# Patient Record
Sex: Female | Born: 1940 | Race: White | Hispanic: No | Marital: Married | Smoking: Never smoker
Health system: Southern US, Community
[De-identification: ages and names within clinical notes are randomized; demographics above are authoritative.]

## PROBLEM LIST (undated history)

## (undated) DIAGNOSIS — R7309 Other abnormal glucose: Secondary | ICD-10-CM

## (undated) DIAGNOSIS — D126 Benign neoplasm of colon, unspecified: Secondary | ICD-10-CM

## (undated) DIAGNOSIS — D649 Anemia, unspecified: Secondary | ICD-10-CM

## (undated) DIAGNOSIS — M1612 Unilateral primary osteoarthritis, left hip: Secondary | ICD-10-CM

## (undated) DIAGNOSIS — I10 Essential (primary) hypertension: Secondary | ICD-10-CM

## (undated) DIAGNOSIS — M5416 Radiculopathy, lumbar region: Secondary | ICD-10-CM

## (undated) DIAGNOSIS — Z9889 Other specified postprocedural states: Secondary | ICD-10-CM

## (undated) DIAGNOSIS — M47816 Spondylosis without myelopathy or radiculopathy, lumbar region: Secondary | ICD-10-CM

## (undated) DIAGNOSIS — R002 Palpitations: Secondary | ICD-10-CM

## (undated) DIAGNOSIS — M25562 Pain in left knee: Secondary | ICD-10-CM

## (undated) DIAGNOSIS — K625 Hemorrhage of anus and rectum: Secondary | ICD-10-CM

## (undated) DIAGNOSIS — K219 Gastro-esophageal reflux disease without esophagitis: Secondary | ICD-10-CM

## (undated) DIAGNOSIS — G8929 Other chronic pain: Secondary | ICD-10-CM

## (undated) DIAGNOSIS — E039 Hypothyroidism, unspecified: Secondary | ICD-10-CM

## (undated) DIAGNOSIS — Z8719 Personal history of other diseases of the digestive system: Secondary | ICD-10-CM

## (undated) DIAGNOSIS — E785 Hyperlipidemia, unspecified: Secondary | ICD-10-CM

## (undated) HISTORY — PX: CHOLECYSTECTOMY: SHX55

## (undated) HISTORY — PX: BREAST CYST ASPIRATION: SHX578

## (undated) HISTORY — PX: JOINT REPLACEMENT: SHX530

---

## 2004-09-30 ENCOUNTER — Ambulatory Visit: Payer: Self-pay | Admitting: Internal Medicine

## 2005-12-13 ENCOUNTER — Ambulatory Visit: Payer: Self-pay | Admitting: Gastroenterology

## 2006-10-16 ENCOUNTER — Ambulatory Visit: Payer: Self-pay | Admitting: Podiatry

## 2006-10-16 ENCOUNTER — Other Ambulatory Visit: Payer: Self-pay

## 2006-10-20 ENCOUNTER — Ambulatory Visit: Payer: Self-pay | Admitting: Podiatry

## 2007-04-03 ENCOUNTER — Ambulatory Visit: Payer: Self-pay | Admitting: Internal Medicine

## 2007-04-12 ENCOUNTER — Ambulatory Visit: Payer: Self-pay | Admitting: Internal Medicine

## 2009-06-10 ENCOUNTER — Ambulatory Visit: Payer: Self-pay | Admitting: Gastroenterology

## 2009-10-02 ENCOUNTER — Observation Stay: Payer: Self-pay | Admitting: Internal Medicine

## 2012-08-28 ENCOUNTER — Ambulatory Visit: Payer: Self-pay | Admitting: Orthopedic Surgery

## 2012-09-20 ENCOUNTER — Ambulatory Visit: Payer: Self-pay | Admitting: Orthopedic Surgery

## 2012-10-15 ENCOUNTER — Ambulatory Visit: Payer: Self-pay | Admitting: Orthopedic Surgery

## 2012-10-15 DIAGNOSIS — I499 Cardiac arrhythmia, unspecified: Secondary | ICD-10-CM

## 2012-10-15 LAB — BASIC METABOLIC PANEL
BUN: 16 mg/dL (ref 7–18)
Chloride: 106 mmol/L (ref 98–107)
Co2: 26 mmol/L (ref 21–32)
Creatinine: 0.73 mg/dL (ref 0.60–1.30)
Potassium: 3.7 mmol/L (ref 3.5–5.1)
Sodium: 140 mmol/L (ref 136–145)

## 2012-10-15 LAB — CBC
HCT: 39.5 % (ref 35.0–47.0)
MCH: 31.1 pg (ref 26.0–34.0)
Platelet: 224 10*3/uL (ref 150–440)
RBC: 4.27 10*6/uL (ref 3.80–5.20)
WBC: 7.6 10*3/uL (ref 3.6–11.0)

## 2012-10-15 LAB — MRSA PCR SCREENING

## 2012-10-25 ENCOUNTER — Inpatient Hospital Stay: Payer: Self-pay | Admitting: General Practice

## 2012-10-26 LAB — HEMOGLOBIN: HGB: 12.2 g/dL (ref 12.0–16.0)

## 2012-10-26 LAB — BASIC METABOLIC PANEL
BUN: 12 mg/dL (ref 7–18)
Calcium, Total: 8.1 mg/dL — ABNORMAL LOW (ref 8.5–10.1)
Chloride: 99 mmol/L (ref 98–107)
Co2: 25 mmol/L (ref 21–32)
Creatinine: 0.69 mg/dL (ref 0.60–1.30)
Osmolality: 266 (ref 275–301)
Potassium: 4.7 mmol/L (ref 3.5–5.1)

## 2012-10-26 LAB — PLATELET COUNT: Platelet: 171 10*3/uL (ref 150–440)

## 2012-10-27 LAB — BASIC METABOLIC PANEL
Calcium, Total: 8.3 mg/dL — ABNORMAL LOW (ref 8.5–10.1)
Chloride: 104 mmol/L (ref 98–107)
Co2: 26 mmol/L (ref 21–32)
EGFR (African American): >60
Osmolality: 272 (ref 275–301)
Potassium: 3.9 mmol/L (ref 3.5–5.1)
Sodium: 137 mmol/L (ref 136–145)

## 2012-10-29 LAB — PATHOLOGY REPORT

## 2013-08-08 ENCOUNTER — Ambulatory Visit: Payer: Self-pay | Admitting: Ophthalmology

## 2013-08-08 DIAGNOSIS — I10 Essential (primary) hypertension: Secondary | ICD-10-CM

## 2013-08-19 ENCOUNTER — Ambulatory Visit: Payer: Self-pay | Admitting: Ophthalmology

## 2013-08-28 ENCOUNTER — Ambulatory Visit: Payer: Self-pay | Admitting: Orthopedic Surgery

## 2013-09-23 ENCOUNTER — Ambulatory Visit: Payer: Self-pay | Admitting: Ophthalmology

## 2013-10-15 ENCOUNTER — Ambulatory Visit: Payer: Self-pay | Admitting: Orthopedic Surgery

## 2013-10-15 LAB — BASIC METABOLIC PANEL
Anion Gap: 6 — ABNORMAL LOW (ref 7–16)
BUN: 11 mg/dL (ref 7–18)
Co2: 29 mmol/L (ref 21–32)
EGFR (African American): >60
Glucose: 83 mg/dL (ref 65–99)
Potassium: 3.6 mmol/L (ref 3.5–5.1)
Sodium: 139 mmol/L (ref 136–145)

## 2013-10-15 LAB — CBC
HCT: 41.1 % (ref 35.0–47.0)
MCHC: 33.8 g/dL (ref 32.0–36.0)
MCV: 90 fL (ref 80–100)
Platelet: 223 10*3/uL (ref 150–440)
RBC: 4.57 10*6/uL (ref 3.80–5.20)
WBC: 7.4 10*3/uL (ref 3.6–11.0)

## 2013-10-15 LAB — URINALYSIS, COMPLETE
Bacteria: NONE SEEN
Bilirubin,UR: NEGATIVE
Glucose,UR: NEGATIVE mg/dL (ref 0–75)
Leukocyte Esterase: NEGATIVE
Nitrite: NEGATIVE
Ph: 5 (ref 4.5–8.0)
RBC,UR: 2 /HPF (ref 0–5)
Squamous Epithelial: <1
WBC UR: 1 /HPF (ref 0–5)

## 2013-10-15 LAB — APTT: Activated PTT: 34.6 secs (ref 23.6–35.9)

## 2013-10-15 LAB — PROTIME-INR
INR: 0.9
Prothrombin Time: 12.8 secs (ref 11.5–14.7)

## 2013-10-15 LAB — SEDIMENTATION RATE: Erythrocyte Sed Rate: 8 mm/hr (ref 0–30)

## 2013-10-24 ENCOUNTER — Inpatient Hospital Stay: Payer: Self-pay | Admitting: Orthopedic Surgery

## 2013-10-25 LAB — BASIC METABOLIC PANEL
Anion Gap: 3 — ABNORMAL LOW (ref 7–16)
BUN: 16 mg/dL (ref 7–18)
CALCIUM: 8.2 mg/dL — AB (ref 8.5–10.1)
CHLORIDE: 101 mmol/L (ref 98–107)
Co2: 27 mmol/L (ref 21–32)
Creatinine: 0.72 mg/dL (ref 0.60–1.30)
EGFR (African American): >60
EGFR (Non-African Amer.): >60
GLUCOSE: 117 mg/dL — AB (ref 65–99)
OSMOLALITY: 265 (ref 275–301)
Potassium: 4 mmol/L (ref 3.5–5.1)
SODIUM: 131 mmol/L — AB (ref 136–145)

## 2013-10-25 LAB — PLATELET COUNT: Platelet: 158 10*3/uL (ref 150–440)

## 2013-10-25 LAB — HEMOGLOBIN: HGB: 11.4 g/dL — AB (ref 12.0–16.0)

## 2013-10-26 LAB — BASIC METABOLIC PANEL
Anion Gap: 2 — ABNORMAL LOW (ref 7–16)
BUN: 9 mg/dL (ref 7–18)
CO2: 31 mmol/L (ref 21–32)
CREATININE: 0.69 mg/dL (ref 0.60–1.30)
Calcium, Total: 7.7 mg/dL — ABNORMAL LOW (ref 8.5–10.1)
Chloride: 101 mmol/L (ref 98–107)
EGFR (Non-African Amer.): >60
Glucose: 104 mg/dL — ABNORMAL HIGH (ref 65–99)
Osmolality: 267 (ref 275–301)
POTASSIUM: 4.1 mmol/L (ref 3.5–5.1)
Sodium: 134 mmol/L — ABNORMAL LOW (ref 136–145)

## 2013-10-26 LAB — HEMOGLOBIN: HGB: 10.9 g/dL — ABNORMAL LOW (ref 12.0–16.0)

## 2013-10-28 LAB — PATHOLOGY REPORT

## 2014-08-14 ENCOUNTER — Ambulatory Visit: Payer: Self-pay | Admitting: Internal Medicine

## 2015-01-05 ENCOUNTER — Ambulatory Visit: Payer: Self-pay | Admitting: Gastroenterology

## 2015-02-06 NOTE — Op Note (Signed)
PATIENT NAME:  Lauren Watkins, Lauren Watkins MR#:  [id number] DATE OF BIRTH:  1941-05-26  DATE OF PROCEDURE:  09/23/2013  PREOPERATIVE DIAGNOSIS:  Cataract, left eye.    POSTOPERATIVE DIAGNOSIS:  Cataract, left eye.  PROCEDURE PERFORMED:  Extracapsular cataract extraction using phacoemulsification with placement of an Alcon SN6CWS, 21.0-diopter posterior chamber lens, serial [id number].  SURGEON:  Loura Back. Argus Caraher, MD  ASSISTANT:  None.  ANESTHESIA:  4% lidocaine and 0.75% Marcaine in a 50/50 mixture with 10 units/mL of Hylenex added, given as a peribulbar.   ANESTHESIOLOGIST:  Dr. Ronelle Nigh  COMPLICATIONS:  None.  ESTIMATED BLOOD LOSS:  Less than 1 ml.  DESCRIPTION OF PROCEDURE:  The patient was brought to the operating room and given a peribulbar block.  The patient was then prepped and draped in the usual fashion.  The vertical rectus muscles were imbricated using 5-0 silk sutures.  These sutures were then clamped to the sterile drapes as bridle sutures.  A limbal peritomy was performed extending two clock hours and hemostasis was obtained with cautery.  A partial thickness scleral groove was made at the surgical limbus and dissected anteriorly in a lamellar dissection using an Alcon crescent knife.  The anterior chamber was entered supero-temporally with a Superblade and through the lamellar dissection with a 2.6 mm keratome.  DisCoVisc was used to replace the aqueous and a continuous tear capsulorrhexis was carried out.  Hydrodissection and hydrodelineation were carried out with balanced salt and a 27 gauge canula.  The nucleus was rotated to confirm the effectiveness of the hydrodissection.  Phacoemulsification was carried out using a divide-and-conquer technique.  Total ultrasound time was 55 seconds with an average power of 24.6 percent and CDE of 23.93.  Irrigation/aspiration was used to remove the residual cortex.  DisCoVisc was used to inflate the capsule and the internal incision was  enlarged to 3 mm with the crescent knife.  The intraocular lens was folded and inserted into the capsular bag using the AcrySert delivery system.  Irrigation/aspiration was used to remove the residual DisCoVisc.  Miostat was injected into the anterior chamber through the paracentesis track to inflate the anterior chamber and induce miosis. A tenth of a milliliter of cefuroxime was injected via the paracentesis tract containing 1 mg of drug. The wound was checked for leaks and none were found. The conjunctiva was closed with cautery and the bridle sutures were removed.  Two drops of 0.3% Vigamox were placed on the eye.   An eye shield was placed on the eye.  The patient was discharged to the recovery room in good condition. ____________________________ Loura Back Harry Shuck, MD sad:sb D: 09/23/2013 13:31:39 ET T: 09/23/2013 14:07:31 ET JOB#: [id number]  cc: Remo Lipps A. Adamarie Izzo, MD, <Dictator> Martie Lee MD ELECTRONICALLY SIGNED 09/30/2013 12:47

## 2015-02-06 NOTE — Discharge Summary (Signed)
PATIENT NAME:  Lauren Watkins, Lauren Watkins MR#:  [id number] DATE OF BIRTH:  1941-02-07  DATE OF ADMISSION:  10/25/2012 DATE OF DISCHARGE:  10/28/2012  ADMITTING DIAGNOSIS: Left knee osteoarthritis.   DISCHARGE DIAGNOSIS: Left knee osteoarthritis.   PROCEDURE: Left total knee replacement.   SURGEON: Laurene Footman, M.D.   ASSISTANT: Rachelle Hora, PA-C.   ANESTHESIA: Spinal.   ESTIMATED BLOOD LOSS: 50 mL.  TOURNIQUET TIME:  67 minutes at 350 mmHg.  IMPLANTS:  Medacta GMK primary total knee systems, size 2 patella, size 4 left standard femoral component, size 3 left fixed tibial component and a size 3, 14 mm UCA tibial insert.   CONDITION: To recovery room stable.   SPECIMEN: Cut ends of bone.   HISTORY: The patient is a 74 year old female who underwent a (Dictation Anomaly)<MISSING TEXT> knee CT. She has discussed knee replacement for her debilitating knee pain. Despite anti-inflammatory medications as well as exercise programs, she has not had any relief. She continues had debilitating pain that limits her ability to do normal activities. She has had difficulty getting through a grocery store and take care of herself at home because of difficulty standing for a prolonged time.   PHYSICAL EXAMINATION:  LUNGS: Clear to auscultation.   HEART: Regular rate and rhythm.  HEENT: She has a full upper and lower dentures.  EXTREMITIES:  On left knee exam she has range of motion of 5 to 100 degrees with varus deformity. She has a large Baker's cyst present, marked patellofemoral and medial joint line crepitation. She walks with an antalgic gait and it appears quite unsteady secondary to pain.   HOSPITAL COURSE:  The patient was admitted to the hospital on 10/25/2012. She had surgery that same day and was brought to the orthopedic floor from the PAC-U in stable condition. Throughout the patient's stay, her vital signs and lab work was monitored and remained within normal limits. The patient progressed very  well with physical therapy and by postoperative day 3, she was ready for discharge home with home PT.   DISCHARGE INSTRUCTIONS:  The patient may begin weight-bearing as tolerated.  She should keep the leg elevated and wear TED hose daily. She is to cough and deep breathe every 1 hour. She should use the Polar Care unit to maintain temperature between 40 and 50 degrees.  She is to keep the dressing clean and dry and call us if any water gets under it. If any bright red bleeding from the incision or wound, she is to call our office. She is to follow-up with [facility] orthopedics in 2 weeks for staple removal.   DISCHARGE MEDICATIONS:  Atorvastatin 20 mg oral tablet, 1 tablet orally once a day at bedtime. Metoprolol succinate 50 mg oral tablet, extended release 1 tablet orally once a day. Synthroid 137 mcg oral tablet 1 tablet orally once a day. Oxycodone 5 mg oral tablet 1 to 2 tabs orally every 4 hours as needed for pain. Tramadol 50 mg oral tablet 1 to 2 tablets oral every 4 hours as needed for pain.  Xarelto 1 tablet orally once a day in the morning, stop after 9 days. Daily multiple vitamins oral tablet 1 tablet orally once a day. She needs to stop taking aspirin  325, one cap orally once a day     ____________________________ Duanne Guess, PA-C tcg:ct D: 10/29/2012 12:46:45 ET T: 10/29/2012 13:02:05 ET JOB#: [id number]  cc: Duanne Guess, PA-C, <Dictator> Laurene Footman, MD Duanne Guess PA ELECTRONICALLY  SIGNED 11/06/2012 15:43

## 2015-02-06 NOTE — Op Note (Signed)
PATIENT NAME:  Lauren Watkins, Lauren Watkins MR#:  [id number] DATE OF BIRTH:  1941-02-26  DATE OF PROCEDURE:  08/19/2013  PREOPERATIVE DIAGNOSIS:  Cataract, right eye.   POSTOPERATIVE DIAGNOSIS:  Cataract, right eye.  PROCEDURE PERFORMED:  Extracapsular cataract extraction using phacoemulsification with placement of an Alcon SN6CWF, 21.0-diopter posterior chamber lens, serial number [id number].  SURGEON:  Loura Back. Trumaine Wimer, MD  ASSISTANT:  None.  ANESTHESIA:  Lidocaine 4% and 0.75% Marcaine in a 50/50 mixture with 10 units/mL of Hylenex added given as a peribulbar.  ANESTHESIOLOGIST: Dr. Kayleen Memos.    COMPLICATIONS:  None.  ESTIMATED BLOOD LOSS:  Less than 1 ml.  DESCRIPTION OF PROCEDURE:  The patient was brought to the operating room and given a peribulbar block.  The patient was then prepped and draped in the usual fashion.  The vertical rectus muscles were imbricated using 5-0 silk sutures.  These sutures were then clamped to the sterile drapes as bridle sutures.  A limbal peritomy was performed extending two clock hours and hemostasis was obtained with cautery.  A partial thickness scleral groove was made at the surgical limbus and dissected anteriorly in a lamellar dissection using an Alcon crescent knife.  The anterior chamber was entered superonasally with a Superblade and through the lamellar dissection with a 2.6 mm keratome.  DisCoVisc was used to replace the aqueous and a continuous tear capsulorrhexis was carried out.  Hydrodissection and hydrodelineation were carried out with balanced salt and a 27 gauge canula.  The nucleus was rotated to confirm the effectiveness of the hydrodissection.  Phacoemulsification was carried out using a divide-and-conquer technique.  Total ultrasound time was 1 minute and 16 seconds with an average power of 25.5%, CDE of 28.60.    Irrigation/aspiration was used to remove the residual cortex.  DisCoVisc was used to inflate the capsule and the internal incision  was enlarged to 3 mm with the crescent knife.  The intraocular lens was folded and inserted into the capsular bag using the AcrySert delivery system.  Irrigation/aspiration was used to remove the residual DisCoVisc.  Miostat was injected into the anterior chamber through the paracentesis track to inflate the anterior chamber and induce miosis.  No antibiotic was injected.  The wound was checked for leaks and none were found. The conjunctiva was closed with cautery and the bridle sutures were removed.  Two drops of 0.3% Vigamox were placed on the eye.   An eye shield was placed on the eye.  The patient was discharged to the recovery room in good condition.  ____________________________ Loura Back Yamilet Mcfayden, MD sad:cs D: 08/19/2013 13:53:21 ET T: 08/19/2013 14:37:42 ET JOB#: [id number]  cc: Remo Lipps A. Katasha Riga, MD, <Dictator> Martie Lee MD ELECTRONICALLY SIGNED 08/26/2013 13:03

## 2015-02-06 NOTE — Op Note (Signed)
PATIENT NAME:  Lauren, Watkins MR#:  [id number] DATE OF BIRTH:  [birth year]  DATE OF PROCEDURE:  10/25/2012  POSTOPERATIVE DIAGNOSIS:  Left knee osteoarthritis.   POSTOPERATIVE DIAGNOSIS:  Left knee osteoarthritis.  PROCEDURE: Left total knee replacement.   SURGEON:  Laurene Footman, M.D.   ASSISTANT:   Rachelle Hora, PA-C.   ANESTHESIA:  Spinal.   DESCRIPTION OF PROCEDURE:  The patient was brought to the operating room. After adequate spinal anesthesia was obtained, the left leg was prepped and draped in the usual sterile fashion with a tourniquet applied to the upper leg. Alvarado legholder was also utilized. After patient identification and time-out procedures were completed, the leg was exsanguinated with an Esmarch, and the tourniquet raised to 300 mmHg. Subsequently, after bone cuts were made,  the tourniquet was raised to 350 mmHg because of oozing through the bone marrow. Inspection of the knee revealed severe tricompartmental osteoarthritis with loss of essentially all the articular cartilage. Lateral compartment had several millimeters of bone loss with sclerotic bone on femoral and tibial condyles. The ACL was excised along with the infrapatellar fat pad. The Medacta cutting block was applied after exposing the proximal tibia, alignment checked and a cutting block applied. The proximal tibia cut was then carried out with excision of the meniscus. The femur was approached in a similar fashion, exposing the femur to apply the Medacta bone cutting block, checking the alignment and resections. They appeared to match the planned resections, and the distal femoral cut was carried out with distal drill holes made. The femur templated to a 4, and the 4 cutting block was applied. Anterior, posterior and chamfer cuts carried out without notching. The tibia sized to a 3, and with 1 of the pins remaining from the guide for rotational alignment, the #3 tibial plate was applied, pinned in place and proximal  drill hole made along with keel to set the implant trial in place with 12 and 14 mm inserts were placed, and the 14 mm insert gave good stability in flexion and extension. The patella was cut with a guide and sized to a size 2 after drill holes were made. The notch and distal femoral drill holes were made as well for final implants. The patella tracked well with no touch technique. At this point, all trial components were removed, and the bone surfaces were thoroughly irrigated and then dried. A combination of Sensorcaine 0.25% with epinephrine, morphine and Toradol was injected into the posterior capsule and medial arthrotomy. The bony surfaces were then dried, and the components cemented in place; first the tibial component, followed by the tibial insert, and then the femoral component, excess cement being removed. The knee is held in extension with the patellar button clamped in place, again, with bone cement. After the bone cement had set, excess cement was removed, and the knee again thoroughly irrigated. Tourniquet was let down, and hemostasis achieved with electrocautery. The arthrotomy was repaired using a heavy quill suture, 2-0 quill subcutaneously and skin staples. Xeroform, 4 x 4's, ABD, Webril, Polar Care device and Ace wrap were then applied. The patient was sent to the recovery room in stable condition.   ESTIMATED BLOOD LOSS: 50 mL.   TOURNIQUET TIME: 67 minutes at 350 mmHg.   IMPLANTS: Medacta GMK Primary Total Knee System, size 2 patella, size 4 left standard femoral component, size 3 left fixed tibial component and a size 3, 14 mm UC tibial insert.   CONDITION: To recovery room stable.  SPECIMENS: Cut ends of bone.    ____________________________ Laurene Footman, MD mjm:dm D: 10/25/2012 12:03:40 ET T: 10/25/2012 12:26:22 ET JOB#: [id number]  cc: Laurene Footman, MD, <Dictator> Laurene Footman MD ELECTRONICALLY SIGNED 10/25/2012 15:13

## 2015-02-07 NOTE — Discharge Summary (Signed)
PATIENT NAME:  Lauren Watkins, Lauren Watkins MR#:  [id number] DATE OF BIRTH:  Oct 28, 1940  DATE OF ADMISSION:  10/24/2013 DATE OF DISCHARGE:  10/28/2013  ADMITTING DIAGNOSIS: Degenerative joint disease, right knee.   DISCHARGE DIAGNOSIS: Degenerative joint disease, right knee.   OPERATION: On 10/24/2013, she had a right total knee arthroplasty.   SURGEON: Hessie Knows, MD   ANESTHESIA: Spinal.   ESTIMATED BLOOD LOSS: 150 mL.  TOURNIQUET TIME: 78 minutes at 300 mmHg.  IMPLANTS: Medacta GMK sphere right 4 femur; 4, 12 insert; T3-4 tibial tray; 2 patella.   COMPLICATIONS: None.    HISTORY: Lauren Watkins is a 74 year old female who failed conservative measures in treatment for her degenerative joint disease of her right knee. She consented to proceed with an elective right total knee arthroplasty.   PHYSICAL EXAMINATION: LUNGS: Clear.  HEART: Regular rate and rhythm.  HEENT: Normal.  MUSCULOSKELETAL: 10 to 110 degrees range of motion of the right knee. Approximately 10 degrees valgus deformity, passively correctable. Significant patellofemoral crepitation with range of motion of the right knee. Distally, she is neurovascularly intact with strong DP pulse and posterior tibialis pulse. There is trace edema.   HOSPITAL COURSE: After initial admission on 10/24/2013, the patient underwent surgery the same day. She had good pain control afterwards and was brought to the orthopedic floor from the PACU. Her initial hemoglobin was 11.4. She was mildly hypotensive in the 90s/50s that day. She was also hyponatremic at 131. Physical therapy was begun on that day, but she did have some issues with nausea. On postoperative day 2, 10/26/2013, she had slow progress with physical therapy. She continued with nausea. On postoperative day 3, 10/27/2013, she continued to have slow progress with physical therapy, and refused her afternoon physical therapy session. She had a BM on this day. On postoperative day 4, 10/28/2013, she is  stable and ready for discharge.   CONDITION AT DISCHARGE: Stable.   DISPOSITION: The patient was sent home with home health PT.   DISCHARGE INSTRUCTIONS: The patient will follow up with [facility] in 2 weeks for staple removal. She will do physical therapy and weight-bear as tolerated on the right lower extremity. Diet is regular. TED hose thigh high bilaterally. Dressing change once daily on an as-needed basis. Please see discharge instructions for a complete list of discharge medications.  ____________________________ Priscille Shadduck M. Tretha Sciara, NP amb:jcm D: 10/28/2013 08:03:47 ET T: 10/28/2013 13:50:44 ET JOB#: [id number]  cc: Undra Trembath M. Tretha Sciara, NP, <Dictator> Kem Kays Giovanna Kemmerer FNP ELECTRONICALLY SIGNED 11/04/2013 8:14

## 2015-02-07 NOTE — Op Note (Signed)
PATIENT NAME:  Lauren Watkins, Lauren Watkins MR#:  [id number] DATE OF BIRTH:  [birth year]  DATE OF PROCEDURE:  10/24/2013  SURGEON: Hessie Knows, M.D.   ASSSISTANT: April Tretha Sciara, NP.   PREOPERATIVE DIAGNOSIS: Right knee osteoarthritis.   POSTOPERATIVE DIAGNOSIS: Right knee osteoarthritis.   PROCEDURE: Right total knee replacement.   ANESTHESIA: Spinal.    DESCRIPTION OF PROCEDURE: The patient was brought to the operating room and after adequate spinal anesthesia was obtained, the right leg was prepped and draped in the usual sterile fashion with a tourniquet applied to the upper thigh. After prepping and draping in the usual sterile fashion, appropriate patient identification and timeout procedures were completed. The leg was exsanguinated with an Esmarch and the tourniquet raised to 300 mmHg. A midline skin incision made, followed by medial parapatellar arthrotomy. Inspection revealed severe tricompartmental osteoarthritis with just a small amount of cartilage remaining on the lateral condyle. The ACL was excised off the infrapatellar fat pad. The proximal tibia was exposed to allow for application of the Medacta tibial cutting guide block. The alignment was checked and the proximal tibia cut carried out.   Next, the femoral alignment guide was placed as well after removing cartilage off this lateral portion of the femur. The distal drill holes were made and the distal femoral cut carried out with resections matching the preop template. The 4r cutting guide was applied, anterior and posterior and chamfer cuts made. Again, cuts matching planned resection. A trial was then placed and posterior osteophytes removed with the use of a small curved osteotome. The distal femoral drill holes were made and the 4 trochlear groove cut was carried out. That was then removed and the posterior horns of the menisci were excised and the tibia, size 3 template was placed. Rotation and alignment appeared to be appropriate. The proximal  drill hole was made, followed by the keel punch. With trial components, a 12 mm insert gave full extension with good stability in all directions.   At this point, the patella was cut using the patellar cutting guide and sized to a size 2 with 3 drill holes made. At this point, all trials were removed and the knee was infiltrated with Exparel diluted with saline injected in the posterior aspect of the knee, medial, lateral and along the capsule along with a combination of morphine, Toradol and Sensorcaine with epinephrine diluted with saline. The tourniquet was let down and there was a small lateral geniculate bleeder, which was cauterized. The tourniquet was then again raised. The bony surfaces thoroughly irrigated and dried. The tibial component was cemented into place first with a 12 mm insert snapped into place with a set screw and then hand tightened. The femoral component was then cemented in place with the knee held in extension as the cement set with excess cement being removed. The patellar component was cemented into place with the patellar clamp and this was left in this position so the cement was entirely hard. There was some cement in the notch along the medial and lateral borders of the femur, which were removed. The knee was again thoroughly irrigated. There was excellent patellofemoral tracking and stability with full extension passively. The arthrotomy was then repaired using a heavy quill suture, 2-0 quill subcutaneously and skin staples. Xeroform, 4 x 4's, ABD, Webril and a Polar Care, followed by Ace wrap applied. The patient was then sent to the recovery room in stable condition.  TOURNIQUET TIME: 78 minutes at 300 mmHg.   ESTIMATED BLOOD LOSS:  150 mL.   COMPLICATIONS: None.   SPECIMEN: Cut ends of bone.   IMPLANTS: Medacta GMK sphere size 4 right femur, 4 right 12 mm insert, size 2 patella and the tibia was the T3-I4 right fixed tibial tray. All components cemented.    ____________________________ Laurene Footman, MD mjm:aw D: 10/24/2013 10:01:18 ET T: 10/24/2013 10:30:36 ET JOB#: [id number]  cc: Laurene Footman, MD, <Dictator> Laurene Footman MD ELECTRONICALLY SIGNED 10/24/2013 14:23

## 2015-02-09 LAB — SURGICAL PATHOLOGY

## 2015-08-12 ENCOUNTER — Other Ambulatory Visit: Payer: Self-pay | Admitting: Internal Medicine

## 2015-08-12 DIAGNOSIS — Z1231 Encounter for screening mammogram for malignant neoplasm of breast: Secondary | ICD-10-CM

## 2015-08-27 ENCOUNTER — Ambulatory Visit: Payer: Medicare Other

## 2015-09-08 ENCOUNTER — Ambulatory Visit
Admission: RE | Admit: 2015-09-08 | Discharge: 2015-09-08 | Disposition: A | Discharge status: Home / Self Care | Payer: Medicare Other | Source: Ambulatory Visit | Attending: Internal Medicine | Admitting: Internal Medicine

## 2015-09-08 ENCOUNTER — Other Ambulatory Visit: Payer: Self-pay | Admitting: Internal Medicine

## 2015-09-08 DIAGNOSIS — Z1231 Encounter for screening mammogram for malignant neoplasm of breast: Secondary | ICD-10-CM | POA: Insufficient documentation

## 2016-08-22 ENCOUNTER — Other Ambulatory Visit: Payer: Self-pay | Admitting: Internal Medicine

## 2016-08-22 DIAGNOSIS — Z1231 Encounter for screening mammogram for malignant neoplasm of breast: Secondary | ICD-10-CM

## 2016-09-02 DIAGNOSIS — F411 Generalized anxiety disorder: Secondary | ICD-10-CM | POA: Insufficient documentation

## 2016-09-15 ENCOUNTER — Ambulatory Visit: Payer: Medicare Other | Attending: Internal Medicine

## 2016-11-14 ENCOUNTER — Ambulatory Visit
Admission: RE | Admit: 2016-11-14 | Discharge: 2016-11-14 | Disposition: A | Discharge status: Home / Self Care | Payer: Medicare Other | Source: Ambulatory Visit | Attending: Internal Medicine | Admitting: Internal Medicine

## 2016-11-14 DIAGNOSIS — Z1231 Encounter for screening mammogram for malignant neoplasm of breast: Secondary | ICD-10-CM

## 2016-12-29 ENCOUNTER — Other Ambulatory Visit: Payer: Self-pay | Admitting: Internal Medicine

## 2016-12-29 DIAGNOSIS — M545 Low back pain, unspecified: Secondary | ICD-10-CM

## 2016-12-29 DIAGNOSIS — G8929 Other chronic pain: Secondary | ICD-10-CM

## 2017-01-11 ENCOUNTER — Ambulatory Visit
Admission: RE | Admit: 2017-01-11 | Discharge: 2017-01-11 | Disposition: A | Discharge status: Home / Self Care | Payer: Medicare Other | Source: Ambulatory Visit | Attending: Internal Medicine | Admitting: Internal Medicine

## 2017-01-11 DIAGNOSIS — M1288 Other specific arthropathies, not elsewhere classified, other specified site: Secondary | ICD-10-CM | POA: Diagnosis not present

## 2017-01-11 DIAGNOSIS — M4316 Spondylolisthesis, lumbar region: Secondary | ICD-10-CM | POA: Insufficient documentation

## 2017-01-11 DIAGNOSIS — Q7649 Other congenital malformations of spine, not associated with scoliosis: Secondary | ICD-10-CM | POA: Insufficient documentation

## 2017-01-11 DIAGNOSIS — M545 Low back pain: Secondary | ICD-10-CM | POA: Insufficient documentation

## 2017-01-11 DIAGNOSIS — G8929 Other chronic pain: Secondary | ICD-10-CM | POA: Diagnosis not present

## 2017-01-11 DIAGNOSIS — M48061 Spinal stenosis, lumbar region without neurogenic claudication: Secondary | ICD-10-CM | POA: Diagnosis not present

## 2017-01-11 LAB — POCT I-STAT CREATININE: Creatinine, Ser: 0.7 mg/dL (ref 0.44–1.00)

## 2017-01-11 MED ORDER — GADOBENATE DIMEGLUMINE 529 MG/ML IV SOLN
20.0000 mL | Freq: Once | INTRAVENOUS | Status: AC | PRN
  Administered 2017-01-11: 18 mL via INTRAVENOUS

## 2017-12-21 ENCOUNTER — Other Ambulatory Visit: Payer: Self-pay | Admitting: Internal Medicine

## 2017-12-21 DIAGNOSIS — Z1231 Encounter for screening mammogram for malignant neoplasm of breast: Secondary | ICD-10-CM

## 2018-01-03 ENCOUNTER — Ambulatory Visit
Admission: RE | Admit: 2018-01-03 | Discharge: 2018-01-03 | Disposition: A | Discharge status: Home / Self Care | Payer: Medicare Other | Source: Ambulatory Visit | Attending: Internal Medicine | Admitting: Internal Medicine

## 2018-01-03 DIAGNOSIS — Z1231 Encounter for screening mammogram for malignant neoplasm of breast: Secondary | ICD-10-CM

## 2018-05-03 ENCOUNTER — Other Ambulatory Visit: Payer: Self-pay | Admitting: Internal Medicine

## 2018-05-03 DIAGNOSIS — M79622 Pain in left upper arm: Secondary | ICD-10-CM

## 2018-05-03 DIAGNOSIS — R1011 Right upper quadrant pain: Secondary | ICD-10-CM

## 2018-05-28 ENCOUNTER — Ambulatory Visit
Admission: RE | Admit: 2018-05-28 | Discharge: 2018-05-28 | Disposition: A | Discharge status: Home / Self Care | Payer: Medicare Other | Source: Ambulatory Visit | Attending: Internal Medicine | Admitting: Internal Medicine

## 2018-05-28 DIAGNOSIS — M79622 Pain in left upper arm: Secondary | ICD-10-CM | POA: Diagnosis present

## 2018-05-28 DIAGNOSIS — R1011 Right upper quadrant pain: Secondary | ICD-10-CM | POA: Diagnosis present

## 2018-08-10 ENCOUNTER — Other Ambulatory Visit: Payer: Self-pay | Admitting: Internal Medicine

## 2018-08-10 DIAGNOSIS — R1031 Right lower quadrant pain: Secondary | ICD-10-CM

## 2018-08-10 DIAGNOSIS — K760 Fatty (change of) liver, not elsewhere classified: Secondary | ICD-10-CM

## 2018-08-21 ENCOUNTER — Ambulatory Visit
Admission: RE | Admit: 2018-08-21 | Discharge: 2018-08-21 | Disposition: A | Discharge status: Home / Self Care | Payer: Medicare Other | Source: Ambulatory Visit | Attending: Internal Medicine | Admitting: Internal Medicine

## 2018-08-21 DIAGNOSIS — K5730 Diverticulosis of large intestine without perforation or abscess without bleeding: Secondary | ICD-10-CM | POA: Insufficient documentation

## 2018-08-21 DIAGNOSIS — M25551 Pain in right hip: Secondary | ICD-10-CM | POA: Insufficient documentation

## 2018-08-21 DIAGNOSIS — K760 Fatty (change of) liver, not elsewhere classified: Secondary | ICD-10-CM | POA: Insufficient documentation

## 2018-08-21 DIAGNOSIS — R1031 Right lower quadrant pain: Secondary | ICD-10-CM | POA: Diagnosis not present

## 2018-08-21 MED ORDER — IOPAMIDOL (ISOVUE-300) INJECTION 61%
100.0000 mL | Freq: Once | INTRAVENOUS | Status: AC | PRN
  Administered 2018-08-21: 100 mL via INTRAVENOUS

## 2018-11-26 DIAGNOSIS — M51369 Other intervertebral disc degeneration, lumbar region without mention of lumbar back pain or lower extremity pain: Secondary | ICD-10-CM | POA: Insufficient documentation

## 2019-07-30 ENCOUNTER — Other Ambulatory Visit: Payer: Self-pay | Admitting: Internal Medicine

## 2019-07-30 DIAGNOSIS — Z1231 Encounter for screening mammogram for malignant neoplasm of breast: Secondary | ICD-10-CM

## 2019-09-03 ENCOUNTER — Ambulatory Visit
Admission: RE | Admit: 2019-09-03 | Discharge: 2019-09-03 | Disposition: A | Discharge status: Home / Self Care | Payer: Medicare Other | Source: Ambulatory Visit | Attending: Internal Medicine | Admitting: Internal Medicine

## 2019-09-03 DIAGNOSIS — Z1231 Encounter for screening mammogram for malignant neoplasm of breast: Secondary | ICD-10-CM | POA: Insufficient documentation

## 2020-11-04 ENCOUNTER — Other Ambulatory Visit: Payer: Self-pay | Admitting: Internal Medicine

## 2020-11-04 DIAGNOSIS — Z1231 Encounter for screening mammogram for malignant neoplasm of breast: Secondary | ICD-10-CM

## 2020-11-24 ENCOUNTER — Other Ambulatory Visit: Payer: Self-pay

## 2020-11-24 ENCOUNTER — Ambulatory Visit
Admission: RE | Admit: 2020-11-24 | Discharge: 2020-11-24 | Disposition: A | Discharge status: Home / Self Care | Payer: Medicare Other | Source: Ambulatory Visit | Attending: Internal Medicine | Admitting: Internal Medicine

## 2020-11-24 DIAGNOSIS — Z1231 Encounter for screening mammogram for malignant neoplasm of breast: Secondary | ICD-10-CM | POA: Diagnosis not present

## 2021-07-28 DIAGNOSIS — I10 Essential (primary) hypertension: Secondary | ICD-10-CM | POA: Insufficient documentation

## 2021-07-28 DIAGNOSIS — R7309 Other abnormal glucose: Secondary | ICD-10-CM | POA: Insufficient documentation

## 2021-10-17 HISTORY — PX: POLYPECTOMY: SHX149

## 2022-03-30 ENCOUNTER — Other Ambulatory Visit: Payer: Self-pay | Admitting: Family Medicine

## 2022-03-30 ENCOUNTER — Other Ambulatory Visit: Payer: Self-pay | Admitting: Internal Medicine

## 2022-03-30 DIAGNOSIS — Z1231 Encounter for screening mammogram for malignant neoplasm of breast: Secondary | ICD-10-CM

## 2022-05-04 ENCOUNTER — Ambulatory Visit
Admission: RE | Admit: 2022-05-04 | Discharge: 2022-05-04 | Disposition: A | Discharge status: Home / Self Care | Payer: Medicare Other | Source: Ambulatory Visit | Attending: Internal Medicine | Admitting: Internal Medicine

## 2022-05-04 DIAGNOSIS — Z1231 Encounter for screening mammogram for malignant neoplasm of breast: Secondary | ICD-10-CM | POA: Diagnosis present

## 2022-05-23 ENCOUNTER — Other Ambulatory Visit: Payer: Self-pay | Admitting: Family Medicine

## 2022-05-23 ENCOUNTER — Other Ambulatory Visit (HOSPITAL_COMMUNITY): Payer: Self-pay | Admitting: Family Medicine

## 2022-05-23 ENCOUNTER — Ambulatory Visit
Admission: RE | Admit: 2022-05-23 | Discharge: 2022-05-23 | Disposition: A | Discharge status: Home / Self Care | Payer: Medicare Other | Source: Ambulatory Visit | Attending: Family Medicine | Admitting: Family Medicine

## 2022-05-23 DIAGNOSIS — M7989 Other specified soft tissue disorders: Secondary | ICD-10-CM

## 2022-05-23 DIAGNOSIS — R29898 Other symptoms and signs involving the musculoskeletal system: Secondary | ICD-10-CM

## 2022-06-17 ENCOUNTER — Other Ambulatory Visit: Payer: Self-pay | Admitting: Family Medicine

## 2022-06-17 ENCOUNTER — Ambulatory Visit
Admission: RE | Admit: 2022-06-17 | Discharge: 2022-06-17 | Disposition: A | Discharge status: Home / Self Care | Payer: Medicare Other | Source: Ambulatory Visit | Attending: Family Medicine | Admitting: Family Medicine

## 2022-06-17 DIAGNOSIS — R1032 Left lower quadrant pain: Secondary | ICD-10-CM

## 2022-06-17 DIAGNOSIS — K625 Hemorrhage of anus and rectum: Secondary | ICD-10-CM

## 2022-06-17 MED ORDER — IOHEXOL 300 MG/ML  SOLN
100.0000 mL | Freq: Once | INTRAMUSCULAR | Status: AC | PRN
  Administered 2022-06-17: 100 mL via INTRAVENOUS

## 2022-08-22 ENCOUNTER — Ambulatory Visit: Payer: Medicare Other

## 2022-08-22 ENCOUNTER — Encounter
Admission: RE | Disposition: A | Discharge status: Home / Self Care | Payer: Self-pay | Source: Home / Self Care | Attending: Gastroenterology

## 2022-08-22 ENCOUNTER — Ambulatory Visit
Admission: RE | Admit: 2022-08-22 | Discharge: 2022-08-22 | Disposition: A | Discharge status: Home / Self Care | Payer: Medicare Other | Attending: Gastroenterology | Admitting: Gastroenterology

## 2022-08-22 ENCOUNTER — Encounter: Payer: Self-pay | Admitting: *Deleted

## 2022-08-22 DIAGNOSIS — R933 Abnormal findings on diagnostic imaging of other parts of digestive tract: Secondary | ICD-10-CM | POA: Insufficient documentation

## 2022-08-22 DIAGNOSIS — K640 First degree hemorrhoids: Secondary | ICD-10-CM | POA: Insufficient documentation

## 2022-08-22 DIAGNOSIS — Z9049 Acquired absence of other specified parts of digestive tract: Secondary | ICD-10-CM | POA: Diagnosis not present

## 2022-08-22 DIAGNOSIS — I10 Essential (primary) hypertension: Secondary | ICD-10-CM | POA: Insufficient documentation

## 2022-08-22 DIAGNOSIS — E039 Hypothyroidism, unspecified: Secondary | ICD-10-CM | POA: Diagnosis not present

## 2022-08-22 DIAGNOSIS — K5730 Diverticulosis of large intestine without perforation or abscess without bleeding: Secondary | ICD-10-CM | POA: Insufficient documentation

## 2022-08-22 DIAGNOSIS — D123 Benign neoplasm of transverse colon: Secondary | ICD-10-CM | POA: Diagnosis not present

## 2022-08-22 DIAGNOSIS — E785 Hyperlipidemia, unspecified: Secondary | ICD-10-CM | POA: Insufficient documentation

## 2022-08-22 HISTORY — DX: Hypothyroidism, unspecified: E03.9

## 2022-08-22 HISTORY — DX: Hyperlipidemia, unspecified: E78.5

## 2022-08-22 HISTORY — DX: Essential (primary) hypertension: I10

## 2022-08-22 HISTORY — PX: COLONOSCOPY WITH PROPOFOL: SHX5780

## 2022-08-22 SURGERY — COLONOSCOPY WITH PROPOFOL
Anesthesia: General

## 2022-08-22 MED ORDER — SODIUM CHLORIDE 0.9 % IV SOLN
INTRAVENOUS | Status: DC
  Administered 2022-08-22: 1000 mL via INTRAVENOUS

## 2022-08-22 MED ORDER — PROPOFOL 10 MG/ML IV BOLUS
INTRAVENOUS | Status: DC | PRN
  Administered 2022-08-22: 40 mg via INTRAVENOUS

## 2022-08-22 MED ORDER — PROPOFOL 500 MG/50ML IV EMUL
INTRAVENOUS | Status: DC | PRN
  Administered 2022-08-22: 140 ug/kg/min via INTRAVENOUS

## 2022-08-22 MED ORDER — LIDOCAINE HCL (CARDIAC) PF 100 MG/5ML IV SOSY
PREFILLED_SYRINGE | INTRAVENOUS | Status: DC | PRN
  Administered 2022-08-22: 50 mg via INTRAVENOUS

## 2022-08-22 MED ORDER — MIDAZOLAM HCL 2 MG/2ML IJ SOLN
INTRAMUSCULAR | Status: AC
  Filled 2022-08-22: qty 2

## 2022-08-22 MED ORDER — PHENYLEPHRINE HCL (PRESSORS) 10 MG/ML IV SOLN
INTRAVENOUS | Status: DC | PRN
  Administered 2022-08-22: 160 ug via INTRAVENOUS

## 2022-08-22 MED ORDER — KETAMINE HCL 10 MG/ML IJ SOLN
INTRAMUSCULAR | Status: DC | PRN
  Administered 2022-08-22: 15 mg via INTRAVENOUS

## 2022-08-22 MED ORDER — MIDAZOLAM HCL 2 MG/2ML IJ SOLN
INTRAMUSCULAR | Status: DC | PRN
  Administered 2022-08-22: 1 mg via INTRAVENOUS

## 2022-08-22 MED ORDER — KETAMINE HCL 50 MG/5ML IJ SOSY
PREFILLED_SYRINGE | INTRAMUSCULAR | Status: AC
  Filled 2022-08-22: qty 5

## 2022-08-22 MED ORDER — SPOT INK MARKER SYRINGE KIT
PACK | SUBMUCOSAL | Status: DC | PRN
  Administered 2022-08-22: .05 mL via SUBMUCOSAL

## 2022-08-22 NOTE — Interval H&P Note (Signed)
History and Physical Interval Note:  08/22/2022 10:35 AM  Lauren Watkins  has presented today for surgery, with the diagnosis of RECTAL BLEEDING.  The various methods of treatment have been discussed with the patient and family. After consideration of risks, benefits and other options for treatment, the patient has consented to  Procedure(s): COLONOSCOPY WITH PROPOFOL (N/A) as a surgical intervention.  The patient's history has been reviewed, patient examined, no change in status, stable for surgery.  I have reviewed the patient's chart and labs.  Questions were answered to the patient's satisfaction.     Lesly Rubenstein  Ok to proceed with colonoscopy

## 2022-08-22 NOTE — Anesthesia Preprocedure Evaluation (Signed)
Anesthesia Evaluation  Patient identified by MRN, date of birth, ID band Patient awake    Reviewed: Allergy & Precautions, NPO status , Patient's Chart, lab work & pertinent test results  History of Anesthesia Complications Negative for: history of anesthetic complications  Airway Mallampati: III  TM Distance: <3 FB Neck ROM: full    Dental  (+) Chipped, Upper Dentures, Lower Dentures   Pulmonary neg pulmonary ROS, neg shortness of breath   Pulmonary exam normal        Cardiovascular Exercise Tolerance: Good hypertension, (-) angina negative cardio ROS Normal cardiovascular exam     Neuro/Psych negative neurological ROS  negative psych ROS   GI/Hepatic negative GI ROS, Neg liver ROS,neg GERD  ,,  Endo/Other  Hypothyroidism    Renal/GU negative Renal ROS  negative genitourinary   Musculoskeletal   Abdominal   Peds  Hematology negative hematology ROS (+)   Anesthesia Other Findings Past Medical History: No date: Hyperlipidemia No date: Hypertension No date: Hypothyroidism  Past Surgical History: 15+ yrs ago: BREAST CYST ASPIRATION; Right     Comment:  neg  BMI    Body Mass Index: 29.25 kg/m      Reproductive/Obstetrics negative OB ROS                             Anesthesia Physical Anesthesia Plan  ASA: 3  Anesthesia Plan: General   Post-op Pain Management:    Induction: Intravenous  PONV Risk Score and Plan: Propofol infusion and TIVA  Airway Management Planned: Natural Airway and Nasal Cannula  Additional Equipment:   Intra-op Plan:   Post-operative Plan:   Informed Consent: I have reviewed the patients History and Physical, chart, labs and discussed the procedure including the risks, benefits and alternatives for the proposed anesthesia with the patient or authorized representative who has indicated his/her understanding and acceptance.     Dental Advisory  Given  Plan Discussed with: Anesthesiologist, CRNA and Surgeon  Anesthesia Plan Comments: (Patient consented for risks of anesthesia including but not limited to:  - adverse reactions to medications - risk of airway placement if required - damage to eyes, teeth, lips or other oral mucosa - nerve damage due to positioning  - sore throat or hoarseness - Damage to heart, brain, nerves, lungs, other parts of body or loss of life  Patient voiced understanding.)       Anesthesia Quick Evaluation

## 2022-08-22 NOTE — Anesthesia Postprocedure Evaluation (Signed)
Anesthesia Post Note  Patient: Amorah B Mcroberts  Procedure(s) Performed: COLONOSCOPY WITH PROPOFOL  Patient location during evaluation: Endoscopy Anesthesia Type: General Level of consciousness: awake and alert Pain management: pain level controlled Vital Signs Assessment: post-procedure vital signs reviewed and stable Respiratory status: spontaneous breathing, nonlabored ventilation, respiratory function stable and patient connected to nasal cannula oxygen Cardiovascular status: blood pressure returned to baseline and stable Postop Assessment: no apparent nausea or vomiting Anesthetic complications: no   No notable events documented.   Last Vitals:  Vitals:   08/22/22 1119 08/22/22 1136  BP: 110/73 138/81  Pulse:  65  Resp: 20 16  Temp:    SpO2:  100%    Last Pain:  Vitals:   08/22/22 0941  TempSrc: Temporal                 Precious Haws Fareeha Evon

## 2022-08-22 NOTE — Transfer of Care (Signed)
Immediate Anesthesia Transfer of Care Note  Patient: Lauren Watkins  Procedure(s) Performed: COLONOSCOPY WITH PROPOFOL  Patient Location: PACU and Endoscopy Unit  Anesthesia Type:General  Level of Consciousness: drowsy  Airway & Oxygen Therapy: Patient Spontanous Breathing  Post-op Assessment: Report given to RN and Post -op Vital signs reviewed and stable  Post vital signs: Reviewed and stable  Last Vitals:  Vitals Value Taken Time  BP 113/62 08/22/22 1109  Temp    Pulse 69 08/22/22 1110  Resp 12 08/22/22 1110  SpO2 95 % 08/22/22 1110  Vitals shown include unvalidated device data.  Last Pain:  Vitals:   08/22/22 0941  TempSrc: Temporal         Complications: No notable events documented.

## 2022-08-22 NOTE — H&P (Signed)
Outpatient short stay form Pre-procedure 08/22/2022  Lesly Rubenstein, MD  Primary Physician: Tracie Harrier, MD  Reason for visit:  Abnormal imaging  History of present illness:    81 y/o gentleman with history of hypertension here for colonoscopy for CT scan that showed cecal thickening. No blood thinners. No family history of GI malignancies. History of cholecystectomy. Last colonoscopy in 2016 was unremarkable.    Current Facility-Administered Medications:    0.9 %  sodium chloride infusion, , Intravenous, Continuous, Hillel Card, Hilton Cork, MD, Last Rate: 20 mL/hr at 08/22/22 1004, Continued from Pre-op at 08/22/22 1004  Medications Prior to Admission  Medication Sig Dispense Refill Last Dose   aspirin EC 81 MG tablet Take 81 mg by mouth daily. Swallow whole.   08/20/2022   losartan (COZAAR) 25 MG tablet Take 25 mg by mouth daily.   08/21/2022     Not on File   Past Medical History:  Diagnosis Date   Hyperlipidemia    Hypertension    Hypothyroidism     Review of systems:  Otherwise negative.    Physical Exam  Gen: Alert, oriented. Appears stated age.  HEENT: PERRLA. Lungs: No respiratory distress CV: RRR Abd: soft, benign, no masses Ext: No edema    Planned procedures: Proceed with colonoscopy. The patient understands the nature of the planned procedure, indications, risks, alternatives and potential complications including but not limited to bleeding, infection, perforation, damage to internal organs and possible oversedation/side effects from anesthesia. The patient agrees and gives consent to proceed.  Please refer to procedure notes for findings, recommendations and patient disposition/instructions.     Lesly Rubenstein, MD [facility]

## 2022-08-22 NOTE — Op Note (Signed)
[facility] Patient Name: Lauren Watkins Procedure Date: 08/22/2022 10:35 AM MRN: [id number] Account #: [id number] Date of Birth: 08-02-1941 Admit Type: Outpatient Age: 81 Room: [facility] ENDO ROOM 3 Gender: Female Note Status: Finalized Instrument Name: Jasper Riling [id number] Procedure:             Colonoscopy Indications:           Abnormal CT of the GI tract Providers:             Andrey Farmer MD, MD Referring MD:          Tracie Harrier, MD (Referring MD) Medicines:             Monitored Anesthesia Care Complications:         No immediate complications. Estimated blood loss:                         Minimal. Procedure:             Pre-Anesthesia Assessment:                        - Prior to the procedure, a History and Physical was                         performed, and patient medications and allergies were                         reviewed. The patient is competent. The risks and                         benefits of the procedure and the sedation options and                         risks were discussed with the patient. All questions                         were answered and informed consent was obtained.                         Patient identification and proposed procedure were                         verified by the physician, the nurse, the                         anesthesiologist, the anesthetist and the technician                         in the endoscopy suite. Mental Status Examination:                         alert and oriented. Airway Examination: normal                         oropharyngeal airway and neck mobility. Respiratory                         Examination: clear to auscultation. CV Examination:  normal. Prophylactic Antibiotics: The patient does not                         require prophylactic antibiotics. Prior                         Anticoagulants: The patient has taken no anticoagulant                          or antiplatelet agents. ASA Grade Assessment: III - A                         patient with severe systemic disease. After reviewing                         the risks and benefits, the patient was deemed in                         satisfactory condition to undergo the procedure. The                         anesthesia plan was to use monitored anesthesia care                         (MAC). Immediately prior to administration of                         medications, the patient was re-assessed for adequacy                         to receive sedatives. The heart rate, respiratory                         rate, oxygen saturations, blood pressure, adequacy of                         pulmonary ventilation, and response to care were                         monitored throughout the procedure. The physical                         status of the patient was re-assessed after the                         procedure.                        After obtaining informed consent, the colonoscope was                         passed under direct vision. Throughout the procedure,                         the patient's blood pressure, pulse, and oxygen                         saturations were monitored continuously. The  Colonoscope was introduced through the anus and                         advanced to the the terminal ileum. The colonoscopy                         was somewhat difficult due to multiple diverticula in                         the colon. The patient tolerated the procedure well.                         The quality of the bowel preparation was adequate to                         identify polyps. The terminal ileum, ileocecal valve,                         appendiceal orifice, and rectum were photographed. Findings:      The perianal and digital rectal examinations were normal.      There is no endoscopic evidence of mass in the cecum.      A 3 mm polyp was found in the hepatic  flexure. The polyp was sessile.       The polyp was removed with a cold snare. Resection and retrieval were       complete. Estimated blood loss was minimal.      A 30 mm polyp was found in the transverse colon. The polyp was granular       lateral spreading. Polypectomy was not attempted. Area was tattooed with       an injection of 0.5 mL of [place] ink a few centimeters distal to the       lesion.      Many medium-mouthed and small-mouthed diverticula were found in the       sigmoid colon and descending colon.      Internal hemorrhoids were found during retroflexion. The hemorrhoids       were Grade I (internal hemorrhoids that do not prolapse). Impression:            - One 3 mm polyp at the hepatic flexure, removed with                         a cold snare. Resected and retrieved.                        - One 30 mm polyp in the transverse colon. Resection                         not attempted. Tattooed.                        - Diverticulosis in the sigmoid colon and in the                         descending colon.                        - Internal hemorrhoids. Recommendation:        - Refer to Advanced  endoscopist for removal of large                         polyp at appointment to be scheduled.                        - Discharge patient to home.                        - Resume previous diet.                        - Continue present medications.                        - Await pathology results. Procedure Code(s):     --- Professional ---                        [phone], Colonoscopy, flexible; with removal of                         tumor(s), polyp(s), or other lesion(s) by snare                         technique                        [id number], Colonoscopy, flexible; with directed submucosal                         injection(s), any substance Diagnosis Code(s):     --- Professional ---                        D12.3, Benign neoplasm of transverse colon (hepatic                          flexure or splenic flexure)                        K64.0, First degree hemorrhoids                        K57.30, Diverticulosis of large intestine without                         perforation or abscess without bleeding                        R93.3, Abnormal findings on diagnostic imaging of                         other parts of digestive tract CPT copyright 2022 [employer]. All rights reserved. The codes documented in this report are preliminary and upon coder review may  be revised to meet current compliance requirements. Andrey Farmer MD, MD 08/22/2022 11:18:33 AM Number of Addenda: 0 Note Initiated On: 08/22/2022 10:35 AM Scope Withdrawal Time: 0 hours 12 minutes 39 seconds  Total Procedure Duration: 0 hours 23 minutes 54 seconds  Estimated Blood Loss:  Estimated blood loss was minimal.      [facility]

## 2022-08-23 ENCOUNTER — Encounter: Payer: Self-pay | Admitting: Gastroenterology

## 2022-08-23 LAB — SURGICAL PATHOLOGY

## 2022-11-25 IMAGING — MG MM DIGITAL SCREENING BILAT W/ TOMO AND CAD
8 series · 8 of 24 positions shown · non-contrast
Comparison: Previous exam(s).

ACR Breast Density Category a: The breast tissue is almost entirely
fatty.

CLINICAL DATA: Screening.

EXAM:
DIGITAL SCREENING BILATERAL MAMMOGRAM WITH TOMOSYNTHESIS AND CAD
TECHNIQUE: Bilateral screening digital craniocaudal and mediolateral oblique
mammograms were obtained. Bilateral screening digital breast
tomosynthesis was performed. The images were evaluated with
computer-aided detection.

[L MLO synth-2D]
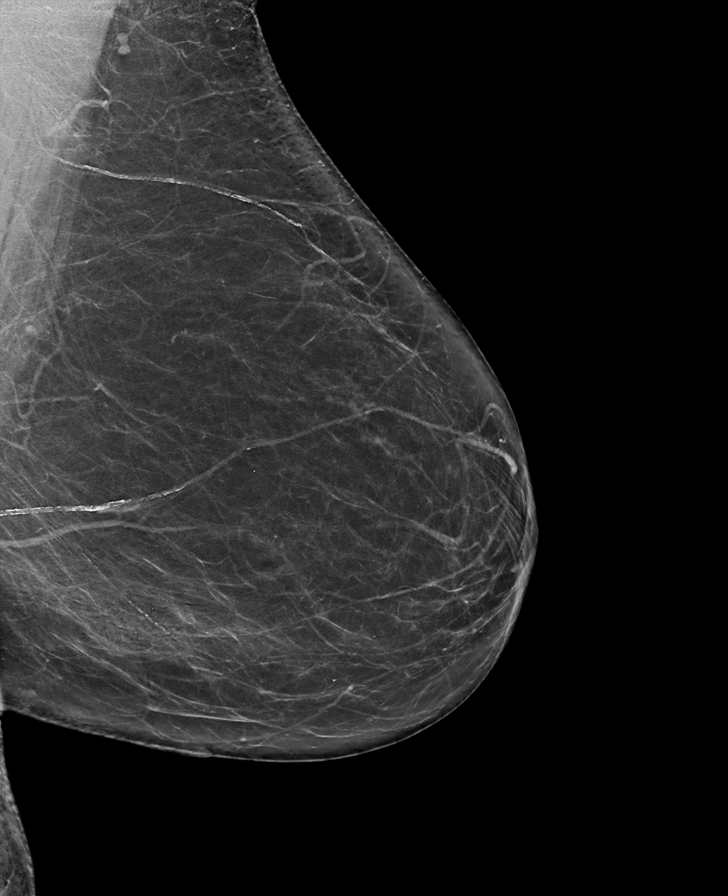

[R CC synth-2D]
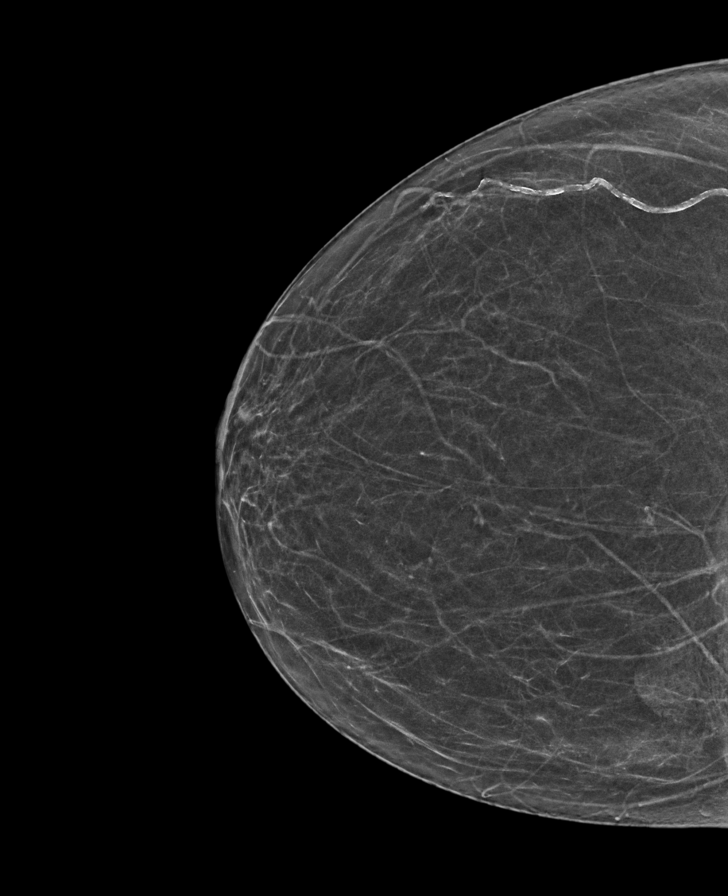

[L CC synth-2D]
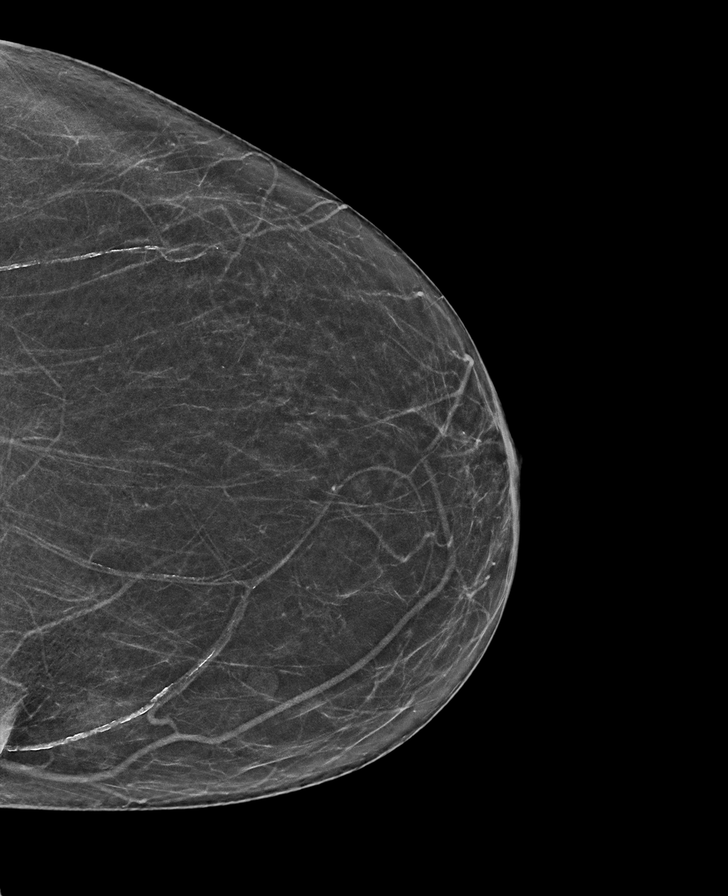

[R MLO synth-2D]
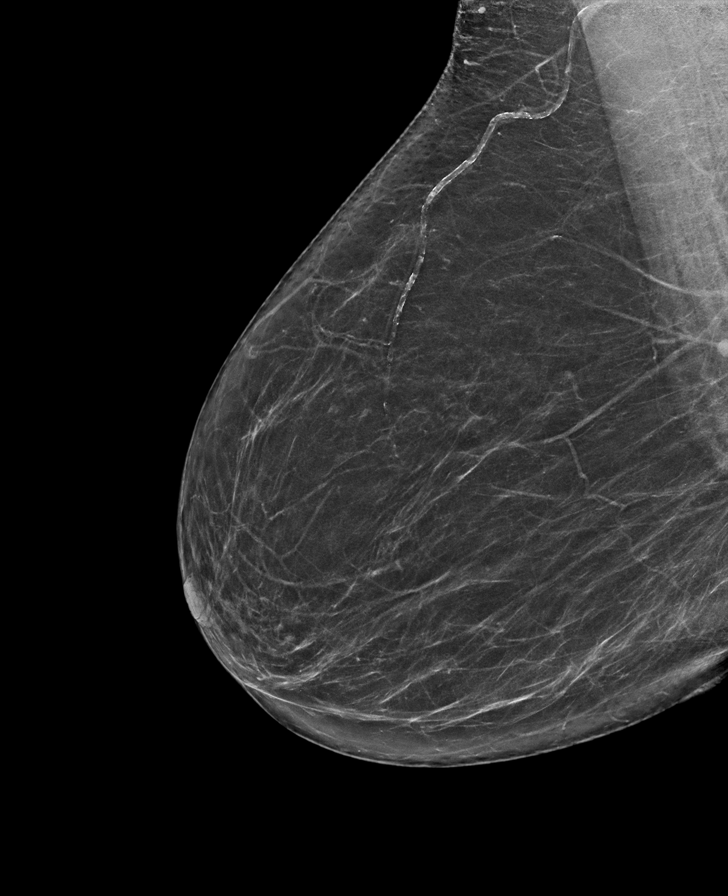

[L CC tomo · tomo slice 31/60.0]
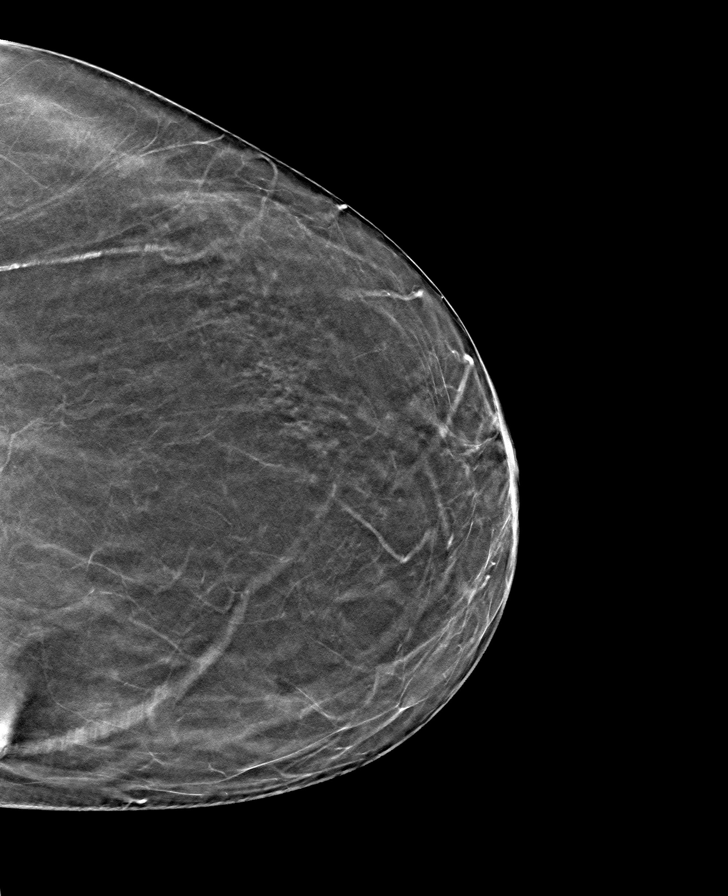

[L MLO tomo · tomo slice 39/78.0]
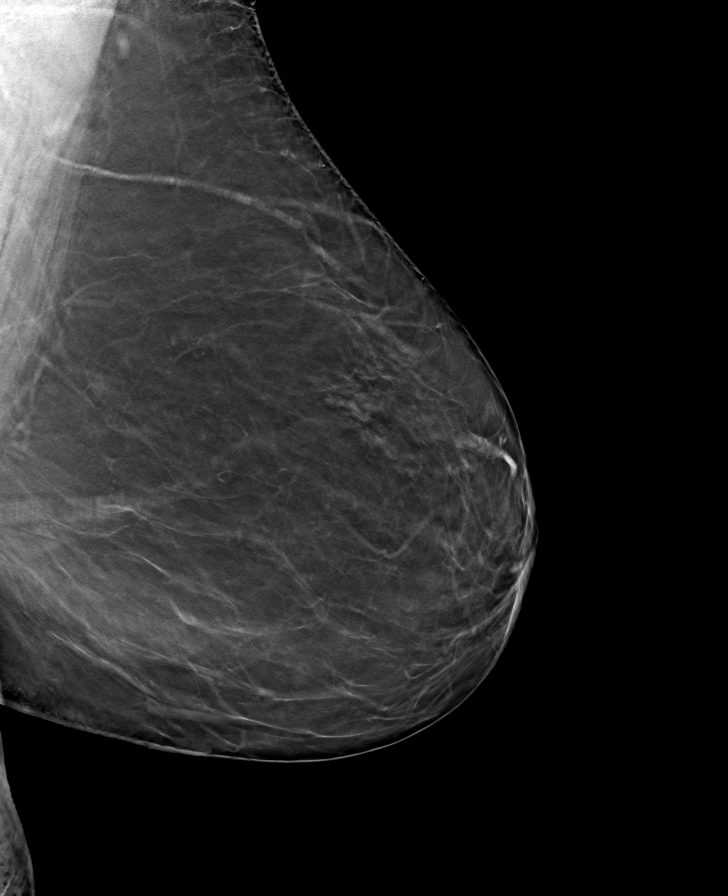

[R MLO tomo · tomo slice 37/73.0]
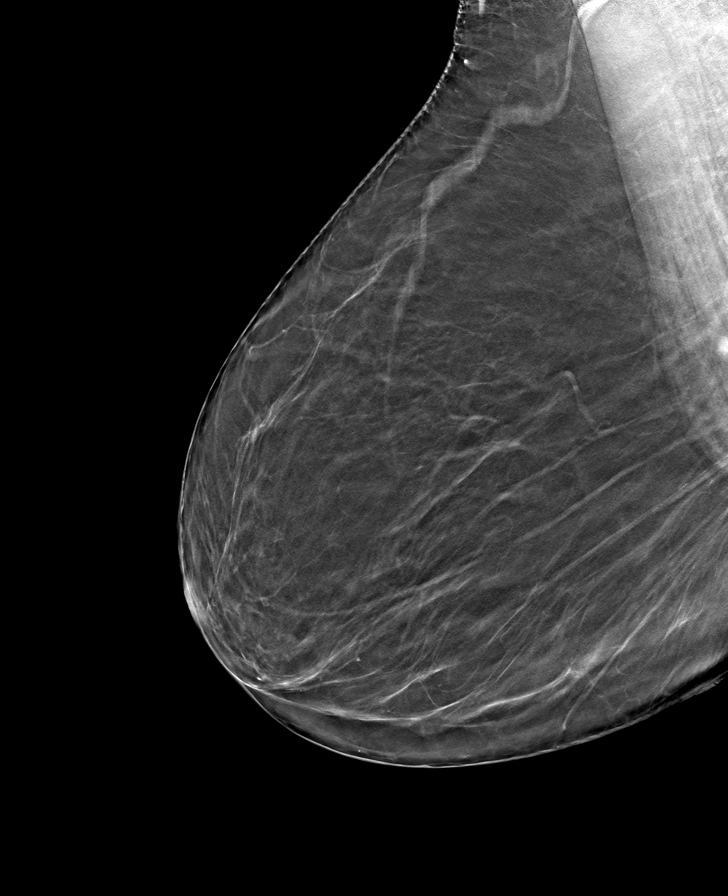

[R CC tomo · tomo slice 29/57.0]
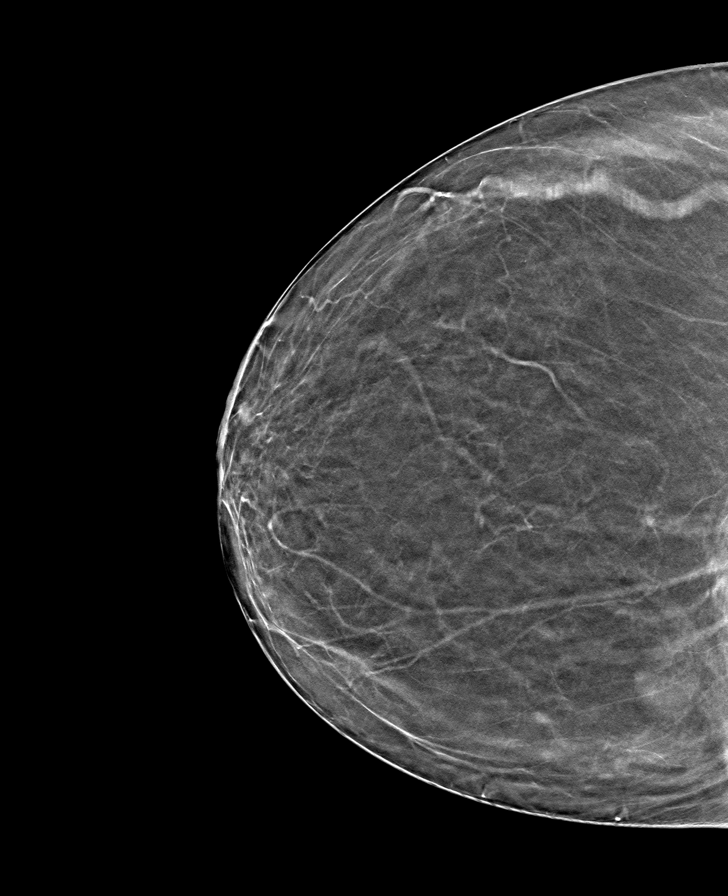

[8 of 24 positions shown; findings below may reference images not displayed]

FINDINGS: There are no findings suspicious for malignancy.
IMPRESSION: No mammographic evidence of malignancy. A result letter of this
screening mammogram will be mailed directly to the patient.

RECOMMENDATION:
Screening mammogram in one year. (Code:0E-3-N98)

BI-RADS CATEGORY  1: Negative.

## 2022-11-27 DIAGNOSIS — M1612 Unilateral primary osteoarthritis, left hip: Principal | ICD-10-CM | POA: Insufficient documentation

## 2022-12-05 DIAGNOSIS — K635 Polyp of colon: Secondary | ICD-10-CM | POA: Insufficient documentation

## 2023-08-15 ENCOUNTER — Other Ambulatory Visit: Payer: Self-pay

## 2023-08-15 ENCOUNTER — Inpatient Hospital Stay
Admission: RE | Admit: 2023-08-15 | Discharge: 2023-08-15 | Disposition: A | Discharge status: Home / Self Care | Payer: Medicare Other | Source: Ambulatory Visit | Attending: Orthopedic Surgery | Admitting: Orthopedic Surgery

## 2023-08-15 VITALS — BP 153/91 | HR 70 | Resp 16 | Ht 68.0 in | Wt 194.9 lb

## 2023-08-15 DIAGNOSIS — R9431 Abnormal electrocardiogram [ECG] [EKG]: Secondary | ICD-10-CM | POA: Insufficient documentation

## 2023-08-15 DIAGNOSIS — R8290 Unspecified abnormal findings in urine: Secondary | ICD-10-CM | POA: Insufficient documentation

## 2023-08-15 DIAGNOSIS — Z01818 Encounter for other preprocedural examination: Secondary | ICD-10-CM | POA: Insufficient documentation

## 2023-08-15 DIAGNOSIS — M1612 Unilateral primary osteoarthritis, left hip: Secondary | ICD-10-CM | POA: Diagnosis not present

## 2023-08-15 DIAGNOSIS — R8281 Pyuria: Secondary | ICD-10-CM | POA: Diagnosis not present

## 2023-08-15 DIAGNOSIS — Z01812 Encounter for preprocedural laboratory examination: Secondary | ICD-10-CM

## 2023-08-15 HISTORY — DX: Other chronic pain: G89.29

## 2023-08-15 HISTORY — DX: Pain in left knee: M25.562

## 2023-08-15 HISTORY — DX: Other specified postprocedural states: Z98.890

## 2023-08-15 HISTORY — DX: Anemia, unspecified: D64.9

## 2023-08-15 HISTORY — DX: Unilateral primary osteoarthritis, left hip: M16.12

## 2023-08-15 HISTORY — DX: Gastro-esophageal reflux disease without esophagitis: K21.9

## 2023-08-15 HISTORY — DX: Radiculopathy, lumbar region: M54.16

## 2023-08-15 HISTORY — DX: Palpitations: R00.2

## 2023-08-15 HISTORY — DX: Personal history of other diseases of the digestive system: Z87.19

## 2023-08-15 HISTORY — DX: Hemorrhage of anus and rectum: K62.5

## 2023-08-15 HISTORY — DX: Benign neoplasm of colon, unspecified: D12.6

## 2023-08-15 HISTORY — DX: Spondylosis without myelopathy or radiculopathy, lumbar region: M47.816

## 2023-08-15 HISTORY — DX: Other abnormal glucose: R73.09

## 2023-08-15 LAB — URINALYSIS, ROUTINE W REFLEX MICROSCOPIC
Bacteria, UA: NONE SEEN
Bilirubin Urine: NEGATIVE
Glucose, UA: NEGATIVE mg/dL
Hgb urine dipstick: NEGATIVE
Ketones, ur: NEGATIVE mg/dL
Nitrite: NEGATIVE
Protein, ur: NEGATIVE mg/dL
Specific Gravity, Urine: 1.012 (ref 1.005–1.030)
pH: 5 (ref 5.0–8.0)

## 2023-08-15 LAB — SEDIMENTATION RATE: Sed Rate: 8 mm/h (ref 0–30)

## 2023-08-15 LAB — SURGICAL PCR SCREEN
MRSA, PCR: NEGATIVE
Staphylococcus aureus: NEGATIVE

## 2023-08-15 LAB — TYPE AND SCREEN
ABO/RH(D): O POS
Antibody Screen: NEGATIVE

## 2023-08-15 LAB — C-REACTIVE PROTEIN: CRP: 0.5 mg/dL (ref <1.0)

## 2023-08-15 NOTE — Patient Instructions (Addendum)
Your procedure is scheduled on: 08/23/23 - Wednesday Report to the Registration Desk on the 1st floor of the [facility]. To find out your arrival time, please call [phone] between 1PM - 3PM on: 08/22/23 - Tuesday If your arrival time is 6:00 am, do not arrive before that time as the [facility] entrance doors do not open until 6:00 am.  REMEMBER: Instructions that are not followed completely may result in serious medical risk, up to and including death; or upon the discretion of your surgeon and anesthesiologist your surgery may need to be rescheduled.  Do not eat food after midnight the night before surgery.  No gum chewing or hard candies.  You may however, drink CLEAR liquids up to 2 hours before you are scheduled to arrive for your surgery. Do not drink anything within 2 hours of your scheduled arrival time.  Clear liquids include: - water  - apple juice without pulp - gatorade (not RED colors) - black coffee or tea (Do NOT add milk or creamers to the coffee or tea) Do NOT drink anything that is not on this list.  In addition, your doctor has ordered for you to drink the provided:  Ensure Pre-Surgery Clear Carbohydrate Drink  Drinking this carbohydrate drink up to two hours before surgery helps to reduce insulin resistance and improve patient outcomes. Please complete drinking 2 hours before scheduled arrival time.  One week prior to surgery: Stop Anti-inflammatories (NSAIDS) such as Advil, Aleve, Ibuprofen, Motrin, Naproxen, Naprosyn and Aspirin based products such as Excedrin, Goody's Powder, BC Powder. You may however, continue to take Tylenol if needed for pain up until the day of surgery.  Stop ANY OVER THE COUNTER supplements until after surgery : TURMERIC THERAWORX NERVE RELIEF , Multiple Vitamin (MULTIVITAMIN ), VITAMIN C .  HOLD losartan (COZAAR) on the day of surgery.   ON THE DAY OF SURGERY ONLY TAKE THESE MEDICATIONS WITH SIPS OF WATER:  levothyroxine  (SYNTHROID)  omeprazole (PRILOSEC)   No Alcohol for 24 hours before or after surgery.  No Smoking including e-cigarettes for 24 hours before surgery.  No chewable tobacco products for at least 6 hours before surgery.  No nicotine patches on the day of surgery.  Do not use any "recreational" drugs for at least a week (preferably 2 weeks) before your surgery.  Please be advised that the combination of cocaine and anesthesia may have negative outcomes, up to and including death. If you test positive for cocaine, your surgery will be cancelled.  On the morning of surgery brush your teeth with toothpaste and water, you may rinse your mouth with mouthwash if you wish. Do not swallow any toothpaste or mouthwash.  Use CHG Soap or wipes as directed on instruction sheet.  Do not wear jewelry, make-up, hairpins, clips or nail polish.  For welded (permanent) jewelry: bracelets, anklets, waist bands, etc.  Please have this removed prior to surgery.  If it is not removed, there is a chance that hospital personnel will need to cut it off on the day of surgery.  Do not wear lotions, powders, or perfumes.   Do not shave body hair from the neck down 48 hours before surgery.  Contact lenses, hearing aids and dentures may not be worn into surgery.  Do not bring valuables to the hospital. [facility] is not responsible for any missing/lost belongings or valuables.   Notify your doctor if there is any change in your medical condition (cold, fever, infection).  Wear comfortable clothing (  specific to your surgery type) to the hospital.  After surgery, you can help prevent lung complications by doing breathing exercises.  Take deep breaths and cough every 1-2 hours. Your doctor may order a device called an Incentive Spirometer to help you take deep breaths. When coughing or sneezing, hold a pillow firmly against your incision with both hands. This is called "splinting." Doing this helps protect your  incision. It also decreases belly discomfort.  If you are being admitted to the hospital overnight, leave your suitcase in the car. After surgery it may be brought to your room.  In case of increased patient census, it may be necessary for you, the patient, to continue your postoperative care in the Same Day Surgery department.  If you are being discharged the day of surgery, you will not be allowed to drive home. You will need a responsible individual to drive you home and stay with you for 24 hours after surgery.   If you are taking public transportation, you will need to have a responsible individual with you.  Please call the Pre-admissions Testing Dept. at [phone] if you have any questions about these instructions.  Surgery Visitation Policy:  Patients having surgery or a procedure may have two visitors.  Children under the age of 37 must have an adult with them who is not the patient.  Inpatient Visitation:    Visiting hours are 7 a.m. to 8 p.m. Up to four visitors are allowed at one time in a patient room. The visitors may rotate out with other people during the day.  One visitor age 59 or older may stay with the patient overnight and must be in the room by 8 p.m.    Pre-operative 5 CHG Bath Instructions   You can play a key role in reducing the risk of infection after surgery. Your skin needs to be as free of germs as possible. You can reduce the number of germs on your skin by washing with CHG (chlorhexidine gluconate) soap before surgery. CHG is an antiseptic soap that kills germs and continues to kill germs even after washing.   DO NOT use if you have an allergy to chlorhexidine/CHG or antibacterial soaps. If your skin becomes reddened or irritated, stop using the CHG and notify one of our RNs at [phone].   Please shower with the CHG soap starting 4 days before surgery using the following schedule: 11/02 - 11/06.    Please keep in mind the following:  DO  NOT shave, including legs and underarms, starting the day of your first shower.   You may shave your face at any point before/day of surgery.  Place clean sheets on your bed the day you start using CHG soap. Use a clean washcloth (not used since being washed) for each shower. DO NOT sleep with pets once you start using the CHG.   CHG Shower Instructions:  If you choose to wash your hair and private area, wash first with your normal shampoo/soap.  After you use shampoo/soap, rinse your hair and body thoroughly to remove shampoo/soap residue.  Turn the water OFF and apply about 3 tablespoons (45 ml) of CHG soap to a CLEAN washcloth.  Apply CHG soap ONLY FROM YOUR NECK DOWN TO YOUR TOES (washing for 3-5 minutes)  DO NOT use CHG soap on face, private areas, open wounds, or sores.  Pay special attention to the area where your surgery is being performed.  If you are having back surgery, having someone  wash your back for you may be helpful. Wait 2 minutes after CHG soap is applied, then you may rinse off the CHG soap.  Pat dry with a clean towel  Put on clean clothes/pajamas   If you choose to wear lotion, please use ONLY the CHG-compatible lotions on the back of this paper.     Additional instructions for the day of surgery: DO NOT APPLY any lotions, deodorants, cologne, or perfumes.   Put on clean/comfortable clothes.  Brush your teeth.  Ask your nurse before applying any prescription medications to the skin.      CHG Compatible Lotions   Aveeno Moisturizing lotion  Cetaphil Moisturizing Cream  Cetaphil Moisturizing Lotion  Clairol Herbal Essence Moisturizing Lotion, Dry Skin  Clairol Herbal Essence Moisturizing Lotion, Extra Dry Skin  Clairol Herbal Essence Moisturizing Lotion, Normal Skin  Curel Age Defying Therapeutic Moisturizing Lotion with Alpha Hydroxy  Curel Extreme Care Body Lotion  Curel Soothing Hands Moisturizing Hand Lotion  Curel Therapeutic Moisturizing Cream,  Fragrance-Free  Curel Therapeutic Moisturizing Lotion, Fragrance-Free  Curel Therapeutic Moisturizing Lotion, Original Formula  Eucerin Daily Replenishing Lotion  Eucerin Dry Skin Therapy Plus Alpha Hydroxy Crme  Eucerin Dry Skin Therapy Plus Alpha Hydroxy Lotion  Eucerin Original Crme  Eucerin Original Lotion  Eucerin Plus Crme Eucerin Plus Lotion  Eucerin TriLipid Replenishing Lotion  Keri Anti-Bacterial Hand Lotion  Keri Deep Conditioning Original Lotion Dry Skin Formula Softly Scented  Keri Deep Conditioning Original Lotion, Fragrance Free Sensitive Skin Formula  Keri Lotion Fast Absorbing Fragrance Free Sensitive Skin Formula  Keri Lotion Fast Absorbing Softly Scented Dry Skin Formula  Keri Original Lotion  Keri Skin Renewal Lotion Keri Silky Smooth Lotion  Keri Silky Smooth Sensitive Skin Lotion  Nivea Body Creamy Conditioning Oil  Nivea Body Extra Enriched Lotion  Nivea Body Original Lotion  Nivea Body Sheer Moisturizing Lotion Nivea Crme  Nivea Skin Firming Lotion  NutraDerm 30 Skin Lotion  NutraDerm Skin Lotion  NutraDerm Therapeutic Skin Cream  NutraDerm Therapeutic Skin Lotion  ProShield Protective Hand Cream  Provon moisturizing lotion   How to Use an Incentive Spirometer  An incentive spirometer is a tool that measures how well you are filling your lungs with each breath. Learning to take long, deep breaths using this tool can help you keep your lungs clear and active. This may help to reverse or lessen your chance of developing breathing (pulmonary) problems, especially infection. You may be asked to use a spirometer: After a surgery. If you have a lung problem or a history of smoking. After a long period of time when you have been unable to move or be active. If the spirometer includes an indicator to show the highest number that you have reached, your health care provider or respiratory therapist will help you set a goal. Keep a log of your progress as told by  your health care provider. What are the risks? Breathing too quickly may cause dizziness or cause you to pass out. Take your time so you do not get dizzy or light-headed. If you are in pain, you may need to take pain medicine before doing incentive spirometry. It is harder to take a deep breath if you are having pain. How to use your incentive spirometer  Sit up on the edge of your bed or on a chair. Hold the incentive spirometer so that it is in an upright position. Before you use the spirometer, breathe out normally. Place the mouthpiece in your mouth. Make sure your  lips are closed tightly around it. Breathe in slowly and as deeply as you can through your mouth, causing the piston or the ball to rise toward the top of the chamber. Hold your breath for 3-5 seconds, or for as long as possible. If the spirometer includes a coach indicator, use this to guide you in breathing. Slow down your breathing if the indicator goes above the marked areas. Remove the mouthpiece from your mouth and breathe out normally. The piston or ball will return to the bottom of the chamber. Rest for a few seconds, then repeat the steps 10 or more times. Take your time and take a few normal breaths between deep breaths so that you do not get dizzy or light-headed. Do this every 1-2 hours when you are awake. If the spirometer includes a goal marker to show the highest number you have reached (best effort), use this as a goal to work toward during each repetition. After each set of 10 deep breaths, cough a few times. This will help to make sure that your lungs are clear. If you have an incision on your chest or abdomen from surgery, place a pillow or a rolled-up towel firmly against the incision when you cough. This can help to reduce pain while taking deep breaths and coughing. General tips When you are able to get out of bed: Walk around often. Continue to take deep breaths and cough in order to clear your  lungs. Keep using the incentive spirometer until your health care provider says it is okay to stop using it. If you have been in the hospital, you may be told to keep using the spirometer at home. Contact a health care provider if: You are having difficulty using the spirometer. You have trouble using the spirometer as often as instructed. Your pain medicine is not giving enough relief for you to use the spirometer as told. You have a fever. Get help right away if: You develop shortness of breath. You develop a cough with bloody mucus from the lungs. You have fluid or blood coming from an incision site after you cough. Summary An incentive spirometer is a tool that can help you learn to take long, deep breaths to keep your lungs clear and active. You may be asked to use a spirometer after a surgery, if you have a lung problem or a history of smoking, or if you have been inactive for a long period of time. Use your incentive spirometer as instructed every 1-2 hours while you are awake. If you have an incision on your chest or abdomen, place a pillow or a rolled-up towel firmly against your incision when you cough. This will help to reduce pain. Get help right away if you have shortness of breath, you cough up bloody mucus, or blood comes from your incision when you cough. This information is not intended to replace advice given to you by your health care provider. Make sure you discuss any questions you have with your health care provider. Document Revised: 12/23/2019 Document Reviewed: 12/23/2019 Elsevier Patient Education  2023 [employer].

## 2023-08-16 LAB — URINE CULTURE: Culture: NO GROWTH

## 2023-08-17 NOTE — H&P (Signed)
ORTHOPAEDIC HISTORY & PHYSICAL Lauren Maudlin, PA - 08/14/2023 2:45 PM EDT Formatting of this note is different from the original. Images from the original note were not included. Chief Complaint Chief Complaint Patient presents with Pre-op Exam Left THA 08/23/23  Reason for Visit Lauren Watkins is a 82 y.o. who presents today for history and physical. She is to undergo a left total hip arthroplasty on 08/23/2023. Since her last visit at the clinic there is been no improvement in her condition. The patient expresses her desire to proceed with surgery.  She has a long history of left hip and groin pain. The pain is worse with weight bearing and rotation of the hip . She has appreciated progressive decrease of her left hip range of motion. The hip pain limits the patient's ability to ambulate long distances. The patient has not appreciated any significant improvement despite activity modification and intraarticular corticosteroid injections. She is not using any ambulatory aids. The patient states that the hip pain has progressed to the point that it is significantly interfering with her activities of daily living.  Of note, the patient has a history of low back pain with some radiation of the pain down the left leg. She has received epidural steroid injections as per Dr. Mariah Milling. Last injection was 06/29/2023. Patient still experiencing some anterior and lateral lower leg pain. However she states that the injection has helped her back.  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 Repeat colonoscopy to be determined at later date/CTL COLONOSCOPY W/REMOVAL LESIONS BY SNARE N/A 03/24/2023 Procedure: COLONOSCOPY, FLEXIBLE; WITH REMOVAL OF TUMOR(S), POLYP(S), OR OTHER LESION(S) BY SNARE TECHNIQUE; Surgeon: Carson Myrtle, MD; Location: [facility]; Service: Gastroenterology; Laterality: N/A;  Past Family History Family History Problem Relation Age of Onset Coronary Artery Disease (Blocked arteries around heart) Mother Angina Mother No Known Problems Father  Medications Current Outpatient Medications Medication Sig Dispense Refill atorvastatin (LIPITOR) 20 MG tablet Take 1 tablet (20 mg total) by mouth at bedtime for 180 days 90 tablet 1 cyanocobalamin (VITAMIN B12) 1000 MCG tablet Take 1,000 mcg by mouth once daily levothyroxine (SYNTHROID) 137 MCG tablet Take 1 tablet (137 mcg total) by mouth once daily Take on an empty stomach with a glass of water at least 30-60 minutes before breakfast. 90 tablet 1 losartan (COZAAR) 25 MG tablet Take 1 tablet (25 mg total) by mouth once daily 90 tablet 1 metoprolol succinate (TOPROL-XL) 50 MG XL tablet Take 1 tablet (50 mg total) by mouth at bedtime for 180 days 90 tablet 1 multivitamin tablet Take 1 tablet by mouth once daily. omeprazole (PRILOSEC) 20 MG DR capsule 1 capsule (20 mg total) once daily as needed turmeric 400 mg Cap Take 400 mg by mouth once daily  No current facility-administered medications for this visit.  Allergies No Known Allergies  Review of Systems A comprehensive 14 point ROS was performed, reviewed, and the pertinent orthopaedic findings are documented in  the HPI.  Exam BP (!) 140/86  Ht  170.2 cm (5\' 7" )  Wt 89 kg (196 lb 3.2 oz)  LMP (LMP Unknown)  BMI 30.73 kg/m  General: Well-developed, well-nourished female seen in no acute distress. Antalgic gait without evidence of significant abductor lurch.  HEENT: Atraumatic, normocephalic. Pupils are equal and reactive to light. Extraocular motion is intact. Sclera are clear. Oropharynx is clear with moist mucosa.  Neck: Supple, nontender, and with good ROM.  Lungs: Normal respiratory effort without audible wheezes.  Cardiovascular: Peripheral pulses are palpable. No lower extremity edema. Homan`s test is negative.   Extremities: Good strength, stability, and range of motion of the upper extremities. Good range of motion of the knees and ankles.  Left Hip: Pelvic tilt: Negative Limb lengths: Equal with the patient standing Soft tissue swelling:Negative Erythema: Negative Crepitance: Negative Tenderness: Greater trochanter is nontender to palpation. Moderate pain is elicited by axial compression or extremes of rotation. Atrophy: No atrophy. Fair to good hip flexor and abductor strength. Range of Motion: Restricted internal and external rotation of the left hip.  Vascular: Peripheral pulses are palpable. Good capillary refill. No gross pretibial or ankle edema. Homans test is negative.  Neurologic: Awake, alert, and oriented. Sensory function is intact to pinprick and light touch. Motor strength is judged to be 5/5. Motor coordination is grossly within normal limits. No apparent clonus. No tremor.  X-ray  1. 3 views of the left hip ordered and interpreted on today's visit shows significant degenerative changes to both hips with that he left one being slightly worse. She is noted to have narrowing to the entire acetabulum. Some spurring is noted off of the femoral head as well as the superior and inferior rim of the acetabulum. No acute bony abnormalities is noted.  Subchondral porosis is noted as well. Mild cystic changes noted  Impression  1. Degenerative arthrosis left hip  Plan  1. Patient's medication was gone over on today's visit 2. Past medical history reviewed 3. Postop rehab course discussed 4. Clinic 6 weeks postop. Sooner if any problems  This note was generated in part with voice recognition software and I apologize for any typographical errors that were not detected and corrected   Tera Partridge PA Electronically signed by Lauren Maudlin, PA at 08/14/2023 3:49 PM EDT

## 2023-08-22 MED ORDER — TRANEXAMIC ACID-NACL 1000-0.7 MG/100ML-% IV SOLN
1000.0000 mg | INTRAVENOUS | Status: AC
  Administered 2023-08-23: 1000 mg via INTRAVENOUS

## 2023-08-22 MED ORDER — CHLORHEXIDINE GLUCONATE 4 % EX SOLN
60.0000 mL | Freq: Once | CUTANEOUS | Status: AC
  Administered 2023-08-23: 4 via TOPICAL

## 2023-08-22 MED ORDER — CELECOXIB 200 MG PO CAPS
400.0000 mg | ORAL_CAPSULE | Freq: Once | ORAL | Status: AC
  Administered 2023-08-23: 400 mg via ORAL

## 2023-08-22 MED ORDER — DEXAMETHASONE SODIUM PHOSPHATE 10 MG/ML IJ SOLN
8.0000 mg | Freq: Once | INTRAMUSCULAR | Status: AC
  Administered 2023-08-23: 8 mg via INTRAVENOUS

## 2023-08-22 MED ORDER — ORAL CARE MOUTH RINSE: 15.0000 mL | Freq: Once | OROMUCOSAL | Status: AC

## 2023-08-22 MED ORDER — GABAPENTIN 300 MG PO CAPS
300.0000 mg | ORAL_CAPSULE | Freq: Once | ORAL | Status: AC
  Administered 2023-08-23: 300 mg via ORAL

## 2023-08-22 MED ORDER — CEFAZOLIN SODIUM-DEXTROSE 2-4 GM/100ML-% IV SOLN
2.0000 g | INTRAVENOUS | Status: AC
  Administered 2023-08-23: 2 g via INTRAVENOUS

## 2023-08-22 MED ORDER — LACTATED RINGERS IV SOLN: INTRAVENOUS | Status: DC

## 2023-08-22 MED ORDER — CHLORHEXIDINE GLUCONATE 0.12 % MT SOLN
15.0000 mL | Freq: Once | OROMUCOSAL | Status: AC
Start: 2023-08-22 — End: 2023-08-23
  Administered 2023-08-23: 15 mL via OROMUCOSAL

## 2023-08-23 ENCOUNTER — Other Ambulatory Visit: Payer: Self-pay

## 2023-08-23 ENCOUNTER — Encounter
Admission: RE | Disposition: A | Discharge status: Home / Self Care | Payer: Self-pay | Source: Home / Self Care | Attending: Orthopedic Surgery

## 2023-08-23 ENCOUNTER — Observation Stay: Payer: Medicare Other

## 2023-08-23 ENCOUNTER — Observation Stay
Admission: RE | Admit: 2023-08-23 | Discharge: 2023-08-24 | Disposition: A | Discharge status: Home / Self Care | Payer: Medicare Other | Attending: Orthopedic Surgery | Admitting: Orthopedic Surgery

## 2023-08-23 ENCOUNTER — Ambulatory Visit: Payer: Medicare Other | Admitting: Urgent Care

## 2023-08-23 ENCOUNTER — Ambulatory Visit: Payer: Medicare Other | Admitting: Certified Registered"

## 2023-08-23 ENCOUNTER — Encounter: Payer: Self-pay | Admitting: Orthopedic Surgery

## 2023-08-23 DIAGNOSIS — M1612 Unilateral primary osteoarthritis, left hip: Principal | ICD-10-CM | POA: Insufficient documentation

## 2023-08-23 DIAGNOSIS — K219 Gastro-esophageal reflux disease without esophagitis: Secondary | ICD-10-CM | POA: Insufficient documentation

## 2023-08-23 DIAGNOSIS — Z79899 Other long term (current) drug therapy: Secondary | ICD-10-CM | POA: Diagnosis not present

## 2023-08-23 DIAGNOSIS — E039 Hypothyroidism, unspecified: Secondary | ICD-10-CM | POA: Insufficient documentation

## 2023-08-23 DIAGNOSIS — I10 Essential (primary) hypertension: Secondary | ICD-10-CM | POA: Diagnosis not present

## 2023-08-23 DIAGNOSIS — Z96642 Presence of left artificial hip joint: Secondary | ICD-10-CM

## 2023-08-23 DIAGNOSIS — Z96653 Presence of artificial knee joint, bilateral: Secondary | ICD-10-CM | POA: Diagnosis not present

## 2023-08-23 DIAGNOSIS — E785 Hyperlipidemia, unspecified: Secondary | ICD-10-CM | POA: Insufficient documentation

## 2023-08-23 DIAGNOSIS — R002 Palpitations: Secondary | ICD-10-CM | POA: Insufficient documentation

## 2023-08-23 HISTORY — PX: TOTAL HIP ARTHROPLASTY: SHX124

## 2023-08-23 LAB — ABO/RH: ABO/RH(D): O POS

## 2023-08-23 SURGERY — ARTHROPLASTY, HIP, TOTAL,POSTERIOR APPROACH
Anesthesia: Spinal | Site: Hip | Laterality: Left

## 2023-08-23 MED ORDER — TRANEXAMIC ACID-NACL 1000-0.7 MG/100ML-% IV SOLN
1000.0000 mg | Freq: Once | INTRAVENOUS | Status: AC
  Administered 2023-08-23: 1000 mg via INTRAVENOUS

## 2023-08-23 MED ORDER — PHENYLEPHRINE HCL-NACL 20-0.9 MG/250ML-% IV SOLN
INTRAVENOUS | Status: DC | PRN
  Administered 2023-08-23: 30 ug/min via INTRAVENOUS

## 2023-08-23 MED ORDER — MAGNESIUM HYDROXIDE 400 MG/5ML PO SUSP
30.0000 mL | Freq: Every day | ORAL | Status: DC
Start: 2023-08-23 — End: 2023-08-24
  Administered 2023-08-24: 30 mL via ORAL

## 2023-08-23 MED ORDER — FERROUS SULFATE 325 (65 FE) MG PO TABS
325.0000 mg | ORAL_TABLET | Freq: Two times a day (BID) | ORAL | Status: DC
  Administered 2023-08-23 – 2023-08-24 (×2): 325 mg via ORAL

## 2023-08-23 MED ORDER — ATORVASTATIN CALCIUM 20 MG PO TABS
20.0000 mg | ORAL_TABLET | Freq: Every day | ORAL | Status: DC
  Administered 2023-08-23: 20 mg via ORAL

## 2023-08-23 MED ORDER — SURGIPHOR WOUND IRRIGATION SYSTEM - OPTIME: TOPICAL | Status: DC | PRN

## 2023-08-23 MED ORDER — FENTANYL CITRATE (PF) 100 MCG/2ML IJ SOLN
INTRAMUSCULAR | Status: DC | PRN
  Administered 2023-08-23 (×2): 50 ug via INTRAVENOUS

## 2023-08-23 MED ORDER — FENTANYL CITRATE (PF) 100 MCG/2ML IJ SOLN
INTRAMUSCULAR | Status: AC
  Filled 2023-08-23: qty 2

## 2023-08-23 MED ORDER — PROPOFOL 1000 MG/100ML IV EMUL
INTRAVENOUS | Status: AC
  Filled 2023-08-23: qty 100

## 2023-08-23 MED ORDER — ONDANSETRON HCL 4 MG/2ML IJ SOLN
INTRAMUSCULAR | Status: DC | PRN
  Administered 2023-08-23: 4 mg via INTRAVENOUS

## 2023-08-23 MED ORDER — CHLORHEXIDINE GLUCONATE 0.12 % MT SOLN
OROMUCOSAL | Status: AC
  Filled 2023-08-23: qty 15

## 2023-08-23 MED ORDER — SENNOSIDES-DOCUSATE SODIUM 8.6-50 MG PO TABS
ORAL_TABLET | ORAL | Status: AC
  Filled 2023-08-23: qty 1

## 2023-08-23 MED ORDER — PHENOL 1.4 % MT LIQD: 1.0000 | OROMUCOSAL | Status: DC | PRN

## 2023-08-23 MED ORDER — METOPROLOL SUCCINATE ER 25 MG PO TB24
50.0000 mg | ORAL_TABLET | Freq: Every day | ORAL | Status: DC
  Administered 2023-08-23: 50 mg via ORAL
  Filled 2023-08-23 (×2): qty 2

## 2023-08-23 MED ORDER — SODIUM CHLORIDE 0.9 % IR SOLN
Status: DC | PRN
  Administered 2023-08-23: 3000 mL

## 2023-08-23 MED ORDER — OXYCODONE HCL 5 MG PO TABS
ORAL_TABLET | ORAL | Status: AC
  Filled 2023-08-23: qty 1

## 2023-08-23 MED ORDER — LIDOCAINE HCL (CARDIAC) PF 100 MG/5ML IV SOSY
PREFILLED_SYRINGE | INTRAVENOUS | Status: DC | PRN
  Administered 2023-08-23: 60 mg via INTRAVENOUS

## 2023-08-23 MED ORDER — CEFAZOLIN SODIUM-DEXTROSE 2-4 GM/100ML-% IV SOLN
INTRAVENOUS | Status: AC
  Filled 2023-08-23: qty 100

## 2023-08-23 MED ORDER — GABAPENTIN 300 MG PO CAPS
ORAL_CAPSULE | ORAL | Status: AC
  Filled 2023-08-23: qty 1

## 2023-08-23 MED ORDER — PANTOPRAZOLE SODIUM 40 MG PO TBEC
DELAYED_RELEASE_TABLET | ORAL | Status: AC
  Filled 2023-08-23: qty 1

## 2023-08-23 MED ORDER — CEFAZOLIN SODIUM-DEXTROSE 2-4 GM/100ML-% IV SOLN
2.0000 g | Freq: Four times a day (QID) | INTRAVENOUS | Status: AC
  Administered 2023-08-23 (×2): 2 g via INTRAVENOUS

## 2023-08-23 MED ORDER — ACETAMINOPHEN 10 MG/ML IV SOLN
INTRAVENOUS | Status: AC
  Filled 2023-08-23: qty 100

## 2023-08-23 MED ORDER — ACETAMINOPHEN 10 MG/ML IV SOLN
INTRAVENOUS | Status: DC | PRN
  Administered 2023-08-23: 1000 mg via INTRAVENOUS

## 2023-08-23 MED ORDER — FERROUS SULFATE 325 (65 FE) MG PO TABS
ORAL_TABLET | ORAL | Status: AC
  Filled 2023-08-23: qty 1

## 2023-08-23 MED ORDER — BUPIVACAINE HCL (PF) 0.5 % IJ SOLN
INTRAMUSCULAR | Status: DC | PRN
  Administered 2023-08-23: 3 mL via INTRATHECAL

## 2023-08-23 MED ORDER — DIPHENHYDRAMINE HCL 12.5 MG/5ML PO ELIX: 12.5000 mg | ORAL_SOLUTION | ORAL | Status: DC | PRN

## 2023-08-23 MED ORDER — METOCLOPRAMIDE HCL 10 MG PO TABS
ORAL_TABLET | ORAL | Status: AC
Start: 2023-08-23 — End: ?
  Filled 2023-08-23: qty 1

## 2023-08-23 MED ORDER — LIDOCAINE HCL (PF) 2 % IJ SOLN
INTRAMUSCULAR | Status: AC
  Filled 2023-08-23: qty 5

## 2023-08-23 MED ORDER — PROPOFOL 500 MG/50ML IV EMUL
INTRAVENOUS | Status: DC | PRN
  Administered 2023-08-23: 100 ug/kg/min via INTRAVENOUS

## 2023-08-23 MED ORDER — EPHEDRINE SULFATE-NACL 50-0.9 MG/10ML-% IV SOSY
PREFILLED_SYRINGE | INTRAVENOUS | Status: DC | PRN
  Administered 2023-08-23 (×2): 10 mg via INTRAVENOUS

## 2023-08-23 MED ORDER — ACETAMINOPHEN 10 MG/ML IV SOLN
1000.0000 mg | Freq: Four times a day (QID) | INTRAVENOUS | Status: DC
  Administered 2023-08-23 – 2023-08-24 (×3): 1000 mg via INTRAVENOUS

## 2023-08-23 MED ORDER — TRAMADOL HCL 50 MG PO TABS: 50.0000 mg | ORAL_TABLET | ORAL | Status: DC | PRN

## 2023-08-23 MED ORDER — ONDANSETRON HCL 4 MG/2ML IJ SOLN
INTRAMUSCULAR | Status: AC
  Filled 2023-08-23: qty 2

## 2023-08-23 MED ORDER — CYCLOBENZAPRINE HCL 5 MG PO TABS: 5.0000 mg | ORAL_TABLET | Freq: Two times a day (BID) | ORAL | Status: DC | PRN

## 2023-08-23 MED ORDER — CELECOXIB 200 MG PO CAPS
ORAL_CAPSULE | ORAL | Status: AC
  Filled 2023-08-23: qty 2

## 2023-08-23 MED ORDER — EPHEDRINE 5 MG/ML INJ
INTRAVENOUS | Status: AC
  Filled 2023-08-23: qty 5

## 2023-08-23 MED ORDER — PANTOPRAZOLE SODIUM 40 MG PO TBEC
40.0000 mg | DELAYED_RELEASE_TABLET | Freq: Two times a day (BID) | ORAL | Status: DC
  Administered 2023-08-23 – 2023-08-24 (×2): 40 mg via ORAL

## 2023-08-23 MED ORDER — ATORVASTATIN CALCIUM 20 MG PO TABS
ORAL_TABLET | ORAL | Status: AC
  Filled 2023-08-23: qty 1

## 2023-08-23 MED ORDER — ASPIRIN 81 MG PO CHEW
81.0000 mg | CHEWABLE_TABLET | Freq: Two times a day (BID) | ORAL | Status: DC
  Administered 2023-08-23 – 2023-08-24 (×2): 81 mg via ORAL

## 2023-08-23 MED ORDER — BUPIVACAINE HCL (PF) 0.5 % IJ SOLN
INTRAMUSCULAR | Status: AC
  Filled 2023-08-23: qty 10

## 2023-08-23 MED ORDER — METOCLOPRAMIDE HCL 10 MG PO TABS
10.0000 mg | ORAL_TABLET | Freq: Three times a day (TID) | ORAL | Status: DC
  Administered 2023-08-23 – 2023-08-24 (×3): 10 mg via ORAL

## 2023-08-23 MED ORDER — LEVOTHYROXINE SODIUM 137 MCG PO TABS
137.0000 ug | ORAL_TABLET | Freq: Every day | ORAL | Status: DC
  Administered 2023-08-24: 137 ug via ORAL
  Filled 2023-08-23: qty 1

## 2023-08-23 MED ORDER — OXYCODONE HCL 5 MG PO TABS: 10.0000 mg | ORAL_TABLET | ORAL | Status: DC | PRN

## 2023-08-23 MED ORDER — 0.9 % SODIUM CHLORIDE (POUR BTL) OPTIME
TOPICAL | Status: DC | PRN
  Administered 2023-08-23: 500 mL

## 2023-08-23 MED ORDER — DEXAMETHASONE SODIUM PHOSPHATE 10 MG/ML IJ SOLN
INTRAMUSCULAR | Status: AC
  Filled 2023-08-23: qty 1

## 2023-08-23 MED ORDER — CELECOXIB 200 MG PO CAPS
ORAL_CAPSULE | ORAL | Status: AC
  Filled 2023-08-23: qty 1

## 2023-08-23 MED ORDER — METOPROLOL TARTRATE 25 MG PO TABS
ORAL_TABLET | ORAL | Status: AC
  Filled 2023-08-23: qty 2

## 2023-08-23 MED ORDER — TRANEXAMIC ACID-NACL 1000-0.7 MG/100ML-% IV SOLN
INTRAVENOUS | Status: AC
  Filled 2023-08-23: qty 100

## 2023-08-23 MED ORDER — MENTHOL 3 MG MT LOZG
1.0000 | LOZENGE | OROMUCOSAL | Status: DC | PRN
Start: 2023-08-23 — End: 2023-08-24

## 2023-08-23 MED ORDER — HYDROMORPHONE HCL 1 MG/ML IJ SOLN: 0.5000 mg | INTRAMUSCULAR | Status: DC | PRN

## 2023-08-23 MED ORDER — CEFAZOLIN SODIUM-DEXTROSE 2-4 GM/100ML-% IV SOLN
INTRAVENOUS | Status: AC
Start: 2023-08-23 — End: ?
  Filled 2023-08-23: qty 100

## 2023-08-23 MED ORDER — PHENYLEPHRINE HCL-NACL 20-0.9 MG/250ML-% IV SOLN
INTRAVENOUS | Status: AC
  Filled 2023-08-23: qty 250

## 2023-08-23 MED ORDER — PHENYLEPHRINE 80 MCG/ML (10ML) SYRINGE FOR IV PUSH (FOR BLOOD PRESSURE SUPPORT)
PREFILLED_SYRINGE | INTRAVENOUS | Status: DC | PRN
  Administered 2023-08-23: 80 ug via INTRAVENOUS

## 2023-08-23 MED ORDER — OXYCODONE HCL 5 MG PO TABS
5.0000 mg | ORAL_TABLET | ORAL | Status: DC | PRN
  Administered 2023-08-23: 5 mg via ORAL

## 2023-08-23 MED ORDER — FLEET ENEMA RE ENEM: 1.0000 | ENEMA | Freq: Once | RECTAL | Status: DC | PRN

## 2023-08-23 MED ORDER — SENNOSIDES-DOCUSATE SODIUM 8.6-50 MG PO TABS
1.0000 | ORAL_TABLET | Freq: Two times a day (BID) | ORAL | Status: DC
  Administered 2023-08-23 – 2023-08-24 (×2): 1 via ORAL

## 2023-08-23 MED ORDER — ASPIRIN 81 MG PO CHEW
CHEWABLE_TABLET | ORAL | Status: AC
Start: 2023-08-23 — End: ?
  Filled 2023-08-23: qty 1

## 2023-08-23 MED ORDER — PROPOFOL 10 MG/ML IV BOLUS
INTRAVENOUS | Status: DC | PRN
  Administered 2023-08-23 (×2): 30 mg via INTRAVENOUS

## 2023-08-23 MED ORDER — LOSARTAN POTASSIUM 50 MG PO TABS
25.0000 mg | ORAL_TABLET | Freq: Every day | ORAL | Status: DC
  Administered 2023-08-24: 25 mg via ORAL

## 2023-08-23 MED ORDER — ENSURE PRE-SURGERY PO LIQD
296.0000 mL | Freq: Once | ORAL | Status: AC
  Administered 2023-08-23: 296 mL via ORAL
  Filled 2023-08-23: qty 296

## 2023-08-23 MED ORDER — BISACODYL 10 MG RE SUPP: 10.0000 mg | Freq: Every day | RECTAL | Status: DC | PRN

## 2023-08-23 MED ORDER — ONDANSETRON HCL 4 MG/2ML IJ SOLN: 4.0000 mg | Freq: Four times a day (QID) | INTRAMUSCULAR | Status: DC | PRN

## 2023-08-23 MED ORDER — ALUM & MAG HYDROXIDE-SIMETH 200-200-20 MG/5ML PO SUSP: 30.0000 mL | ORAL | Status: DC | PRN

## 2023-08-23 MED ORDER — SODIUM CHLORIDE 0.9 % IV SOLN: INTRAVENOUS | Status: DC

## 2023-08-23 MED ORDER — ACETAMINOPHEN 325 MG PO TABS: 325.0000 mg | ORAL_TABLET | Freq: Four times a day (QID) | ORAL | Status: DC | PRN

## 2023-08-23 MED ORDER — ONDANSETRON HCL 4 MG PO TABS: 4.0000 mg | ORAL_TABLET | Freq: Four times a day (QID) | ORAL | Status: DC | PRN

## 2023-08-23 MED ORDER — CELECOXIB 200 MG PO CAPS
200.0000 mg | ORAL_CAPSULE | Freq: Two times a day (BID) | ORAL | Status: DC
  Administered 2023-08-23 – 2023-08-24 (×2): 200 mg via ORAL

## 2023-08-23 SURGICAL SUPPLY — 49 items
BLADE SAW 90X25X1.19 OSCILLAT (BLADE) ×1 IMPLANT
BRUSH SCRUB EZ PLAIN DRY (MISCELLANEOUS) ×1 IMPLANT
CUP ACETBLR 52 OD 100 SERIES (Hips) IMPLANT
DRAPE INCISE IOBAN 66X60 STRL (DRAPES) ×1 IMPLANT
DRAPE SHEET LG 3/4 BI-LAMINATE (DRAPES) ×1 IMPLANT
DRSG AQUACEL AG ADV 3.5X14 (GAUZE/BANDAGES/DRESSINGS) ×1 IMPLANT
DRSG MEPILEX SACRM 8.7X9.8 (GAUZE/BANDAGES/DRESSINGS) ×1 IMPLANT
DRSG TEGADERM 4X4.75 (GAUZE/BANDAGES/DRESSINGS) ×1 IMPLANT
DURAPREP 26ML APPLICATOR (WOUND CARE) ×2 IMPLANT
ELECT CAUTERY BLADE 6.4 (BLADE) ×1 IMPLANT
ELECT REM PT RETURN 9FT ADLT (ELECTROSURGICAL) ×1
ELECTRODE REM PT RTRN 9FT ADLT (ELECTROSURGICAL) ×1 IMPLANT
EVACUATOR 1/8 PVC DRAIN (DRAIN) ×1 IMPLANT
GLOVE BIOGEL M STRL SZ7.5 (GLOVE) ×6 IMPLANT
GLOVE SURG UNDER POLY LF SZ8 (GLOVE) ×2 IMPLANT
GOWN STRL REUS W/ TWL LRG LVL3 (GOWN DISPOSABLE) ×2 IMPLANT
GOWN STRL REUS W/ TWL XL LVL3 (GOWN DISPOSABLE) ×1 IMPLANT
GOWN STRL REUS W/TWL LRG LVL3 (GOWN DISPOSABLE) ×2
GOWN STRL REUS W/TWL XL LVL3 (GOWN DISPOSABLE) ×1
GOWN TOGA ZIPPER T7+ PEEL AWAY (MISCELLANEOUS) ×1 IMPLANT
HANDLE YANKAUER SUCT OPEN TIP (MISCELLANEOUS) ×1 IMPLANT
HEAD M SROM 36MM PLUS 1.5 (Hips) IMPLANT
HOLDER FOLEY CATH W/STRAP (MISCELLANEOUS) ×1 IMPLANT
HOOD PEEL AWAY T7 (MISCELLANEOUS) ×1 IMPLANT
IV NS IRRIG 3000ML ARTHROMATIC (IV SOLUTION) ×1 IMPLANT
KIT PEG BOARD PINK (KITS) ×1 IMPLANT
KIT TURNOVER KIT A (KITS) ×1 IMPLANT
LINER NEUTRAL 52X36MM PLUS 4 (Liner) IMPLANT
MANIFOLD NEPTUNE II (INSTRUMENTS) ×2 IMPLANT
NS IRRIG 500ML POUR BTL (IV SOLUTION) ×1 IMPLANT
PACK HIP PROSTHESIS (MISCELLANEOUS) ×1 IMPLANT
PULSAVAC PLUS IRRIG FAN TIP (DISPOSABLE) ×1
SOLUTION IRRIG SURGIPHOR (IV SOLUTION) ×1 IMPLANT
SPONGE DRAIN TRACH 4X4 STRL 2S (GAUZE/BANDAGES/DRESSINGS) ×1 IMPLANT
SROM M HEAD 36MM PLUS 1.5 (Hips) ×1 IMPLANT
STAPLER SKIN PROX 35W (STAPLE) ×1 IMPLANT
STEM FEM SZ3 STD ACTIS (Stem) IMPLANT
SUT ETHIBOND #5 BRAIDED 30INL (SUTURE) ×1 IMPLANT
SUT VIC AB 0 CT1 36 (SUTURE) ×2 IMPLANT
SUT VIC AB 1 CT1 36 (SUTURE) ×2 IMPLANT
SUT VIC AB 2-0 CT1 27 (SUTURE) ×1
SUT VIC AB 2-0 CT1 TAPERPNT 27 (SUTURE) ×1 IMPLANT
TAPE CLOTH 3X10 WHT NS LF (GAUZE/BANDAGES/DRESSINGS) ×1 IMPLANT
TIP FAN IRRIG PULSAVAC PLUS (DISPOSABLE) ×1 IMPLANT
TOWEL OR 17X26 4PK STRL BLUE (TOWEL DISPOSABLE) IMPLANT
TRAP FLUID SMOKE EVACUATOR (MISCELLANEOUS) ×1 IMPLANT
TRAY FOLEY MTR SLVR 16FR STAT (SET/KITS/TRAYS/PACK) ×1 IMPLANT
TUBING CONNECTING 10 (TUBING) ×2 IMPLANT
WATER STERILE IRR 1000ML POUR (IV SOLUTION) ×1 IMPLANT

## 2023-08-23 NOTE — Plan of Care (Signed)
  Problem: Pain Management: Goal: Pain level will decrease with appropriate interventions Outcome: Progressing   Problem: Skin Integrity: Goal: Will show signs of wound healing Outcome: Progressing   

## 2023-08-23 NOTE — Anesthesia Preprocedure Evaluation (Signed)
Anesthesia Evaluation  Patient identified by MRN, date of birth, ID band Patient awake    Reviewed: Allergy & Precautions, NPO status , Patient's Chart, lab work & pertinent test results  History of Anesthesia Complications (+) PONV and history of anesthetic complications  Airway Mallampati: II  TM Distance: >3 FB Neck ROM: full    Dental no notable dental hx.    Pulmonary neg pulmonary ROS   Pulmonary exam normal        Cardiovascular hypertension, negative cardio ROS Normal cardiovascular exam     Neuro/Psych  PSYCHIATRIC DISORDERS Anxiety      Neuromuscular disease    GI/Hepatic Neg liver ROS, hiatal hernia,GERD  Controlled,,  Endo/Other  Hypothyroidism    Renal/GU      Musculoskeletal  (+) Arthritis ,    Abdominal   Peds  Hematology   Anesthesia Other Findings Past Medical History: No date: Acute pain of left knee No date: Adenomatous colon polyp No date: Anemia No date: Chronic bilateral low back pain with bilateral sciatica No date: Elevated hemoglobin A1c No date: Gastroesophageal reflux disease, unspecified whether  esophagitis present No date: History of hiatal hernia     Comment:  tiny No date: Hyperlipidemia No date: Hypertension No date: Hypothyroidism No date: Lumbar radiculitis No date: Lumbar spondylosis No date: Palpitations No date: PONV (postoperative nausea and vomiting) No date: Primary osteoarthritis of left hip No date: Rectal bleeding  Past Surgical History: 15+ yrs ago: BREAST CYST ASPIRATION; Right     Comment:  neg No date: CHOLECYSTECTOMY 08/22/2022: COLONOSCOPY WITH PROPOFOL; N/A     Comment:  Procedure: COLONOSCOPY WITH PROPOFOL;  Surgeon:               Regis Bill, MD;  Location: [facility];                Service: Endoscopy;  Laterality: N/A; No date: JOINT REPLACEMENT; Bilateral     Comment:  2015 and 2016 2023: POLYPECTOMY     Comment:  9  polyps  BMI    Body Mass Index: 29.63 kg/m      Reproductive/Obstetrics negative OB ROS                             Anesthesia Physical Anesthesia Plan  ASA: 3  Anesthesia Plan: Spinal   Post-op Pain Management:    Induction:   PONV Risk Score and Plan: 3 and Propofol infusion, TIVA and Treatment may vary due to age or medical condition  Airway Management Planned: Natural Airway and Nasal Cannula  Additional Equipment:   Intra-op Plan:   Post-operative Plan:   Informed Consent: I have reviewed the patients History and Physical, chart, labs and discussed the procedure including the risks, benefits and alternatives for the proposed anesthesia with the patient or authorized representative who has indicated his/her understanding and acceptance.     Dental Advisory Given  Plan Discussed with: Anesthesiologist, CRNA and Surgeon  Anesthesia Plan Comments: (Patient reports no bleeding problems and no anticoagulant use.  Plan for spinal with backup GA  Patient consented for risks of anesthesia including but not limited to:  - adverse reactions to medications - damage to eyes, teeth, lips or other oral mucosa - nerve damage due to positioning  - risk of bleeding, infection and or nerve damage from spinal that could lead to paralysis - risk of headache or failed spinal - damage to teeth, lips or other oral  mucosa - sore throat or hoarseness - damage to heart, brain, nerves, lungs, other parts of body or loss of life  Patient voiced understanding and assent.)        Anesthesia Quick Evaluation

## 2023-08-23 NOTE — Progress Notes (Signed)
Patient is not able to walk the distance required to go the bathroom, or he/she is unable to safely negotiate stairs required to access the bathroom.  A 3in1 BSC will alleviate this problem   Lauren Watkins, Jr. M.D.  

## 2023-08-23 NOTE — Evaluation (Signed)
Physical Therapy Evaluation Patient Details Name: Lauren Watkins MRN: [id number] DOB: 19-Mar-1941 Today's Date: 08/23/2023  History of Present Illness  Pt is an 82 y.o. F diagnosed with degenerative arthrosis of the left hip, s/p elective L THA. PMH includes: GERD, HTN, anxiety, palpitations, hypothyroidism, and hyperlipidemia.  Clinical Impression  Pt was pleasant and motivated to participate during the session and put forth good effort throughout. Pt fully oriented and found reclined in bed with family. Pt required no physical assist with bed mobility, transfers, or gait, but definite cuing to observe THA precautions and manage AD safely. Pt admitted mild dizziness/light-headedness both reclined supine and seated EOB; vitals EOB recorded as BP of 133/77, SpO2 94%, and HR 82. Vitals monitored throughout session and remained WNL. Pt able to manage brief gait training in room (below) responding well with no adverse symptoms reported, only min pain throughout. Pt will benefit from continued PT services upon discharge to safely address deficits listed in patient problem list for decreased caregiver assistance and eventual return to PLOF.       If plan is discharge home, recommend the following: A little help with walking and/or transfers;A little help with bathing/dressing/bathroom;Assistance with cooking/housework;Help with stairs or ramp for entrance;Assist for transportation   Can travel by private vehicle        Equipment Recommendations Rolling walker (2 wheels)  Recommendations for Other Services       Functional Status Assessment Patient has had a recent decline in their functional status and demonstrates the ability to make significant improvements in function in a reasonable and predictable amount of time.     Precautions / Restrictions Precautions Precautions: Fall;Posterior Hip Precaution Booklet Issued: Yes Precaution Comments: Weight Bearing Restrictions: Yes LLE Weight Bearing:  Weight bearing as tolerated      Mobility  Bed Mobility Overal bed mobility: Needs Assistance Bed Mobility: Supine to Sit     Supine to sit: Contact guard, HOB elevated, Used rails     General bed mobility comments: assist for L LE mobility, req extra time    Transfers Overall transfer level: Needs assistance Equipment used: Rolling walker (2 wheels) Transfers: Sit to/from Stand Sit to Stand: Contact guard assist, From elevated surface           General transfer comment: cuing for sequencing and hand placement, increased use of R LE,    Ambulation/Gait Ambulation/Gait assistance: Contact guard assist Gait Distance (Feet): 8 Feet Assistive device: Rolling walker (2 wheels) Gait Pattern/deviations: Step-to pattern, Decreased step length - right, Decreased step length - left, Decreased stride length, Decreased weight shift to left Gait velocity: decreased     General Gait Details: pt able to take small steps in room, req cuing for sequencing with AD, able to progressively increase weight shift onto L LE  Stairs            Wheelchair Mobility     Tilt Bed    Modified Rankin (Stroke Patients Only)       Balance Overall balance assessment: Needs assistance Sitting-balance support: Feet supported Sitting balance-Leahy Scale: Good     Standing balance support: Bilateral upper extremity supported, During functional activity Standing balance-Leahy Scale: Fair Standing balance comment: favoring R LE, with cuing able to increase weight shift onto L LE and effectively use bilat hands on AD                             Pertinent Vitals/Pain Pain  Assessment Pain Assessment: 0-10 Pain Score: 2  Pain Location: L leg Pain Descriptors / Indicators: Aching, Sore Pain Intervention(s): Monitored during session, Repositioned, Ice applied    Home Living Family/patient expects to be discharged to:: Private residence Living Arrangements:  Spouse/significant other;Other (Comment) (multiple children/grandchildren on same farm property) Available Help at Discharge: Family;Available 24 hours/day Type of Home: House Home Access: Stairs to enter Entrance Stairs-Rails: None Entrance Stairs-Number of Steps: 2   Home Layout: One level Home Equipment: BSC/3in1;Other (comment) (pt thinks she may have rollator)      Prior Function Prior Level of Function : Independent/Modified Independent             Mobility Comments: pt reported as community ambulator - only limited by pain for long distances, exercised on elliptical for 30 mins 2x/day 6-7days/week prior ADLs Comments: pt reported ind with all ADLs     Extremity/Trunk Assessment   Upper Extremity Assessment Upper Extremity Assessment: Overall WFL for tasks assessed    Lower Extremity Assessment Lower Extremity Assessment: LLE deficits/detail LLE Deficits / Details: weakness/heaviness 2/2 to surgery LLE Sensation: WNL       Communication   Communication Communication: No apparent difficulties Cueing Techniques: Verbal cues;Tactile cues;Visual cues  Cognition Arousal: Alert Behavior During Therapy: WFL for tasks assessed/performed Overall Cognitive Status: Within Functional Limits for tasks assessed                                          General Comments      Exercises Total Joint Exercises Ankle Circles/Pumps: AROM, Both, 5 reps, Supine Marching in Standing: AROM, 10 reps, Both (minimal ROM) Other Exercises Other Exercises: Pt/family education on THA precautions (handout) both verbally and with mobility in session.   Assessment/Plan    PT Assessment Patient needs continued PT services  PT Problem List Decreased strength;Decreased range of motion;Decreased activity tolerance;Decreased balance;Decreased knowledge of use of DME;Decreased coordination;Decreased mobility;Decreased knowledge of precautions;Pain       PT Treatment  Interventions DME instruction;Gait training;Stair training;Functional mobility training;Therapeutic activities;Patient/family education;Balance training;Neuromuscular re-education;Therapeutic exercise    PT Goals (Current goals can be found in the Care Plan section)  Acute Rehab PT Goals Patient Stated Goal: return to walking without pain PT Goal Formulation: With patient Time For Goal Achievement: 09/05/23 Potential to Achieve Goals: Good    Frequency BID     Co-evaluation               AM-PAC PT "6 Clicks" Mobility  Outcome Measure Help needed turning from your back to your side while in a flat bed without using bedrails?: None Help needed moving from lying on your back to sitting on the side of a flat bed without using bedrails?: A Little Help needed moving to and from a bed to a chair (including a wheelchair)?: A Little Help needed standing up from a chair using your arms (e.g., wheelchair or bedside chair)?: A Little Help needed to walk in hospital room?: A Little Help needed climbing 3-5 steps with a railing? : A Lot 6 Click Score: 18    End of Session Equipment Utilized During Treatment: Gait belt Activity Tolerance: Patient tolerated treatment well Patient left: in chair;with call bell/phone within reach;with family/visitor present Nurse Communication: Mobility status;Other (comment) (need for equipment) PT Visit Diagnosis: Other abnormalities of gait and mobility (R26.89);Pain;Unsteadiness on feet (R26.81) Pain - Right/Left: Left Pain - part of  body: Hip    Time: 9562-1308 PT Time Calculation (min) (ACUTE ONLY): 44 min   Charges:               Rosiland Oz SPT 08/23/23, 5:27 PM

## 2023-08-23 NOTE — Interval H&P Note (Signed)
History and Physical Interval Note:  08/23/2023 7:22 AM  Lauren Watkins  has presented today for surgery, with the diagnosis of PRIMARY OSTEOARTHRITIS OF LEFT HIP.Marland Kitchen  The various methods of treatment have been discussed with the patient and family. After consideration of risks, benefits and other options for treatment, the patient has consented to  Procedure(s): TOTAL HIP ARTHROPLASTY (Left) as a surgical intervention.  The patient's history has been reviewed, patient examined, no change in status, stable for surgery.  I have reviewed the patient's chart and labs.  Questions were answered to the patient's satisfaction.     Clarion Mooneyhan P Akira Adelsberger

## 2023-08-23 NOTE — Transfer of Care (Signed)
Immediate Anesthesia Transfer of Care Note  Patient: Lauren Watkins  Procedure(s) Performed: TOTAL HIP ARTHROPLASTY (Left: Hip)  Patient Location: PACU  Anesthesia Type:Spinal  Level of Consciousness: awake  Airway & Oxygen Therapy: Patient Spontanous Breathing and Patient connected to nasal cannula oxygen  Post-op Assessment: Report given to RN and Post -op Vital signs reviewed and stable  Post vital signs: Reviewed and stable  Last Vitals:  Vitals Value Taken Time  BP 107/60 08/23/23 1204  Temp    Pulse 85 08/23/23 1209  Resp 16 08/23/23 1209  SpO2 99 % 08/23/23 1209  Vitals shown include unfiled device data.  Last Pain:  Vitals:   08/23/23 0710  TempSrc: Oral  PainSc: 0-No pain         Complications: No notable events documented.

## 2023-08-23 NOTE — Op Note (Signed)
OPERATIVE NOTE  DATE OF SURGERY:  08/23/2023  PATIENT NAME:  Lauren Watkins   DOB: [birth year]  MRN: [id number]  PRE-OPERATIVE DIAGNOSIS: Degenerative arthrosis of the left hip, primary  POST-OPERATIVE DIAGNOSIS:  Same  PROCEDURE:  Left total hip arthroplasty  SURGEON:  Jena Gauss. M.D.  ASSISTANT:  Gean Birchwood, PA-C (present and scrubbed throughout the case, critical for assistance with exposure, retraction, instrumentation, and closure)  ANESTHESIA: spinal  ESTIMATED BLOOD LOSS: 100 mL  FLUIDS REPLACED: 600 mL of crystalloid  DRAINS: 2 medium Hemovac drains  IMPLANTS UTILIZED: DePuy size 3 standard offset Actis femoral stem, 52 mm OD Pinnacle 100 acetabular component, +4 mm neutral Pinnacle Altrx polyethylene insert, and a 36 mm M-SPEC +1.5 mm hip ball  INDICATIONS FOR SURGERY: Lauren Watkins is a 83 y.o. year old female with a long history of progressive hip and groin  pain. X-rays demonstrated severe degenerative changes. The patient had not seen any significant improvement despite conservative nonsurgical intervention. After discussion of the risks and benefits of surgical intervention, the patient expressed understanding of the risks benefits and agree with plans for total hip arthroplasty.   The risks, benefits, and alternatives were discussed at length including but not limited to the risks of infection, bleeding, nerve injury, stiffness, blood clots, the need for revision surgery, limb length inequality, dislocation, cardiopulmonary complications, among others, and they were willing to proceed.  PROCEDURE IN DETAIL: The patient was brought into the operating room and, after adequate spinal anesthesia was achieved, the patient was placed in a right lateral decubitus position. Axillary roll was placed and all bony prominences were well-padded. The patient's left hip was cleaned and prepped with alcohol and DuraPrep and draped in the usual sterile fashion. A "timeout" was  performed as per usual protocol. A lateral curvilinear incision was made gently curving towards the posterior superior iliac spine. The IT band was incised in line with the skin incision and the fibers of the gluteus maximus were split in line. The piriformis tendon was identified, skeletonized, and incised at its insertion to the proximal femur and reflected posteriorly. A T type posterior capsulotomy was performed. Prior to dislocation of the femoral head, a threaded Steinmann pin was inserted through a separate stab incision into the pelvis superior to the acetabulum and bent in the form of a stylus so as to assess limb length and hip offset throughout the procedure. The femoral head was then dislocated posteriorly. Inspection of the femoral head demonstrated severe degenerative changes with full-thickness loss of articular cartilage. The femoral neck cut was performed using an oscillating saw. The anterior capsule was elevated off of the femoral neck using a periosteal elevator. Attention was then directed to the acetabulum. The remnant of the labrum was excised using electrocautery. Inspection of the acetabulum also demonstrated significant degenerative changes. The acetabulum was reamed in sequential fashion up to a 51 mm diameter. Good punctate bleeding bone was encountered. A 52 mm Pinnacle 100 acetabular component was positioned and impacted into place. Good scratch fit was appreciated. A +4 mm neutral polyethylene trial was inserted.  Attention was then directed to the proximal femur.  Femoral broaches were inserted in a sequential fashion up to a size 3 broach. Calcar region was planed and a trial reduction was performed using a standard offset neck and a 36 mm hip ball with a +1.5 mm neck length. Good equalization of limb lengths and hip offset was appreciated and excellent stability was noted both anteriorly and  posteriorly. Trial components were removed. The acetabular shell was irrigated with  copious amounts of normal saline with antibiotic solution and suctioned dry. A +4 mm neutral Pinnacle Altrx polyethylene insert was positioned and impacted into place. Next, a size 3 standard offset Actis femoral stem was positioned and impacted into place. Excellent scratch fit was appreciated. A trial reduction was again performed with a 36 mm hip ball with a +1.5 mm neck length. Again, good equalization of limb lengths was appreciated and excellent stability appreciated both anteriorly and posteriorly. The hip was then dislocated and the trial hip ball was removed. The Morse taper was cleaned and dried. A 36 mm M-SPEC hip ball with a +1.5 mm neck length was placed on the trunnion and impacted into place. The hip was then reduced and placed through range of motion. Excellent stability was appreciated both anteriorly and posteriorly.  The wound was irrigated with copious amounts of normal saline followed by 450 ml of Surgiphor and suctioned dry. Good hemostasis was appreciated. The posterior capsulotomy was repaired using #5 Ethibond. Piriformis tendon was reapproximated to the undersurface of the gluteus medius tendon using #5 Ethibond. The IT band was reapproximated using interrupted sutures of #1 Vicryl. Subcutaneous tissue was approximated using first #0 Vicryl followed by #2-0 Vicryl. The skin was closed with skin staples.  The patient tolerated the procedure well and was transported to the recovery room in stable condition.   Jena Gauss., M.D.

## 2023-08-24 ENCOUNTER — Encounter: Payer: Self-pay | Admitting: Orthopedic Surgery

## 2023-08-24 DIAGNOSIS — M1612 Unilateral primary osteoarthritis, left hip: Secondary | ICD-10-CM | POA: Diagnosis not present

## 2023-08-24 MED ORDER — SENNOSIDES-DOCUSATE SODIUM 8.6-50 MG PO TABS
ORAL_TABLET | ORAL | Status: AC
  Filled 2023-08-24: qty 1

## 2023-08-24 MED ORDER — FERROUS SULFATE 325 (65 FE) MG PO TABS
ORAL_TABLET | ORAL | Status: AC
  Filled 2023-08-24: qty 1

## 2023-08-24 MED ORDER — CELECOXIB 200 MG PO CAPS
ORAL_CAPSULE | ORAL | Status: AC
Start: 2023-08-24 — End: ?
  Filled 2023-08-24: qty 1

## 2023-08-24 MED ORDER — ASPIRIN 81 MG PO CHEW
CHEWABLE_TABLET | ORAL | Status: AC
  Filled 2023-08-24: qty 1

## 2023-08-24 MED ORDER — CELECOXIB 200 MG PO CAPS: 200.0000 mg | ORAL_CAPSULE | Freq: Two times a day (BID) | ORAL | 1 refills | Status: AC

## 2023-08-24 MED ORDER — LOSARTAN POTASSIUM 50 MG PO TABS
ORAL_TABLET | ORAL | Status: AC
  Filled 2023-08-24: qty 1

## 2023-08-24 MED ORDER — PANTOPRAZOLE SODIUM 40 MG PO TBEC
DELAYED_RELEASE_TABLET | ORAL | Status: AC
Start: 2023-08-24 — End: ?
  Filled 2023-08-24: qty 1

## 2023-08-24 MED ORDER — ACETAMINOPHEN 10 MG/ML IV SOLN
INTRAVENOUS | Status: AC
  Filled 2023-08-24: qty 100

## 2023-08-24 MED ORDER — ASPIRIN 81 MG PO TBEC: 81.0000 mg | DELAYED_RELEASE_TABLET | Freq: Two times a day (BID) | ORAL | 12 refills | Status: AC

## 2023-08-24 MED ORDER — MAGNESIUM HYDROXIDE 400 MG/5ML PO SUSP
ORAL | Status: AC
  Filled 2023-08-24: qty 30

## 2023-08-24 MED ORDER — OXYCODONE HCL 5 MG PO TABS: 5.0000 mg | ORAL_TABLET | ORAL | 0 refills | Status: AC | PRN

## 2023-08-24 MED ORDER — METOCLOPRAMIDE HCL 10 MG PO TABS
ORAL_TABLET | ORAL | Status: AC
  Filled 2023-08-24: qty 1

## 2023-08-24 MED ORDER — TRAMADOL HCL 50 MG PO TABS: 50.0000 mg | ORAL_TABLET | ORAL | 0 refills | Status: AC | PRN

## 2023-08-24 NOTE — Plan of Care (Signed)
Patient progressing towards goals.

## 2023-08-24 NOTE — Addendum Note (Signed)
Addendum  created 08/24/23 0958 by Louie Boston, MD   Flowsheet accepted

## 2023-08-24 NOTE — Discharge Instructions (Signed)
Instructions after Total Hip Replacement   James P. Angie Fava., M.D.    Dept. of Orthopaedics & Sports Medicine [facility] [address] [place], [place]  [address]  Phone: [phone]   Fax: [phone]        [email or web address]        DIET: Drink plenty of non-alcoholic fluids. Resume your normal diet. Include foods high in fiber.  ACTIVITY:  You may use crutches or a walker with weight-bearing as tolerated, unless instructed otherwise. You may be weaned off of the walker or crutches by your Physical Therapist.  Do NOT reach below the level of your knees or cross your legs until allowed.    Continue doing gentle exercises. Exercising will reduce the pain and swelling, increase motion, and prevent muscle weakness.   Please continue to use the TED compression stockings for 6 weeks. You may remove the stockings at night, but should reapply them in the morning. Do not drive or operate any equipment until instructed.  WOUND CARE:  Continue to use ice packs periodically to reduce pain and swelling. Keep the incision clean and dry. You may bathe or shower after the staples are removed at the first office visit following surgery.  MEDICATIONS: You may resume your regular medications. Please take the pain medication as prescribed on the medication. Do not take pain medication on an empty stomach. You have been given a prescription for a blood thinner to prevent blood clots - Aspirin 81 mg, please take two times daily. Wear your TED hose in conjunction for 6 weeks to help prevent DVTs Pain medications and iron supplements can cause constipation. Use a stool softener (Senokot or Colace) on a daily basis and a laxative (dulcolax or miralax) as needed. Do not drive or drink alcoholic beverages when taking pain medications.  CALL THE OFFICE FOR: Temperature above 101 degrees Excessive bleeding or drainage on the dressing. Excessive swelling, coldness, or paleness of the  toes. Persistent nausea and vomiting.  FOLLOW-UP:  You should have an appointment to return to the office in 6 weeks after surgery. Arrangements have been made for continuation of Physical Therapy (either home therapy or outpatient therapy).

## 2023-08-24 NOTE — Discharge Summary (Signed)
Physician Discharge Summary  Subjective: 1 Day Post-Op Procedure(s) (LRB): TOTAL HIP ARTHROPLASTY (Left) Patient reports pain as mild.   Patient seen in rounds with Dr. Ernest Pine. Patient is well, and has had no acute complaints or problems.  Denies any CP, SOB, N/B, fevers or chills Patient is ready to go home  Physician Discharge Summary  Patient ID: Lauren Watkins MRN: [id number] DOB/AGE: 04-05-1941 82 y.o.  Admit date: 08/23/2023 Discharge date: 08/24/2023  Admission Diagnoses:  Discharge Diagnoses:  Principal Problem:   Hx of total hip arthroplasty, left   Discharged Condition: good  Hospital Course: Patient presented to the hospital on 08/23/2023 for an elective left total hip arthroplasty performed by Dr. Ernest Pine.  The patient was given 1 g of TXA and 2 g of Ancef perioperatively.  She tolerated the procedure well without any complications.  See procedural note for details below.  Postoperatively, the patient did very well.  She was able to pass her PT protocols on postop day 1.  Her JP drain was removed without difficulty, intact.  She was able to go to the bathroom.  Her vital signs are stable.  She denies any CP, SOB, N/B, fevers or chills.  Patient is stable for discharge home.  PROCEDURE:  Left total hip arthroplasty   SURGEON:  Jena Gauss. M.D.   ASSISTANT:  Gean Birchwood, PA-C (present and scrubbed throughout the case, critical for assistance with exposure, retraction, instrumentation, and closure)   ANESTHESIA: spinal   ESTIMATED BLOOD LOSS: 100 mL   FLUIDS REPLACED: 600 mL of crystalloid   DRAINS: 2 medium Hemovac drains   IMPLANTS UTILIZED: DePuy size 3 standard offset Actis femoral stem, 52 mm OD Pinnacle 100 acetabular component, +4 mm neutral Pinnacle Altrx polyethylene insert, and a 36 mm M-SPEC +1.5 mm hip ball  Treatments: None  Discharge Exam: Blood pressure 125/69, pulse 64, temperature 97.8 F (36.6 C), temperature source Oral, resp. rate 17,  height 5\' 8"  (1.727 m), weight 88.4 kg, SpO2 95%.   Disposition: Home   Allergies as of 08/24/2023   No Known Allergies      Medication List     TAKE these medications    acetaminophen 500 MG tablet Commonly known as: TYLENOL Take 1,000 mg by mouth every 6 (six) hours as needed for moderate pain (pain score 4-6).   aspirin EC 81 MG tablet Take 1 tablet (81 mg total) by mouth 2 (two) times daily. Swallow whole. What changed: when to take this   atorvastatin 20 MG tablet Commonly known as: LIPITOR Take 20 mg by mouth at bedtime.   celecoxib 200 MG capsule Commonly known as: CELEBREX Take 1 capsule (200 mg total) by mouth 2 (two) times daily.   cyanocobalamin 1000 MCG tablet Commonly known as: VITAMIN B12 Take 1,000 mcg by mouth daily.   cyclobenzaprine 5 MG tablet Commonly known as: FLEXERIL Take 5 mg by mouth 2 (two) times daily as needed for muscle spasms.   levothyroxine 137 MCG tablet Commonly known as: SYNTHROID Take 137 mcg by mouth daily before breakfast.   losartan 25 MG tablet Commonly known as: COZAAR Take 25 mg by mouth daily.   metoprolol succinate 50 MG 24 hr tablet Commonly known as: TOPROL-XL Take 50 mg by mouth at bedtime.   multivitamin with minerals Tabs tablet Take 1 tablet by mouth daily.   omeprazole 20 MG capsule Commonly known as: PRILOSEC Take 20 mg by mouth daily as needed (acid reflux).   oxyCODONE 5 MG  immediate release tablet Commonly known as: Oxy IR/ROXICODONE Take 1 tablet (5 mg total) by mouth every 4 (four) hours as needed for moderate pain (pain score 4-6) (pain score 4-6).   THERAWORX NERVE RELIEF EX Apply 1 Application topically daily as needed (pain).   traMADol 50 MG tablet Commonly known as: ULTRAM Take 1-2 tablets (50-100 mg total) by mouth every 4 (four) hours as needed for moderate pain (pain score 4-6).   TURMERIC PO Take 400 mg by mouth daily.   vitamin C 1000 MG tablet Take 1,000 mg by mouth daily.                Durable Medical Equipment  (From admission, onward)           Start     Ordered   08/23/23 1220  DME Walker rolling  Once       Question:  Patient needs a walker to treat with the following condition  Answer:  S/P total hip arthroplasty   08/23/23 1219   08/23/23 1220  DME Bedside commode  Once       Comments: Patient is not able to walk the distance required to go the bathroom, or he/she is unable to safely negotiate stairs required to access the bathroom.  A 3in1 BSC will alleviate this problem  Question:  Patient needs a bedside commode to treat with the following condition  Answer:  S/P total hip arthroplasty   08/23/23 1219            Follow-up Information     Hooten, Illene Labrador, MD Follow up on 10/05/2023.   Specialty: Orthopedic Surgery Why: at 2:45pm Contact information: [address] [facility] [place] [address] [phone]                 Signed: Gean Birchwood 08/24/2023, 7:55 AM   Objective: Vital signs in last 24 hours: Temp:  [97.6 F (36.4 C)-98.4 F (36.9 C)] 97.8 F (36.6 C) (11/07 0521) Pulse Rate:  [62-89] 64 (11/07 0730) Resp:  [11-19] 17 (11/07 0730) BP: (91-133)/(60-99) 125/69 (11/07 0730) SpO2:  [88 %-98 %] 95 % (11/06 2032)  Intake/Output from previous day:  Intake/Output Summary (Last 24 hours) at 08/24/2023 0755 Last data filed at 08/24/2023 0741 Gross per 24 hour  Intake 1100 ml  Output 530 ml  Net 570 ml    Intake/Output this shift: Total I/O In: -  Out: 30 [Drains:30]  Labs: No results for input(s): "HGB" in the last 72 hours. No results for input(s): "WBC", "RBC", "HCT", "PLT" in the last 72 hours. No results for input(s): "NA", "K", "CL", "CO2", "BUN", "CREATININE", "GLUCOSE", "CALCIUM" in the last 72 hours. No results for input(s): "LABPT", "INR" in the last 72 hours.  EXAM: General - Patient is Alert, Appropriate, and Oriented Extremity - Neurologically intact ABD  soft Neurovascular intact Sensation intact distally Intact pulses distally Dorsiflexion/Plantar flexion intact No cellulitis present Compartment soft Dressing - dressing C/D/I and no drainage Motor Function - intact, moving foot and toes well on exam. Able to plantar and dorsiflex with good strength and ROM.  She is neurovascularly intact all dermatomes down her left lower extremity.  Posterior tibial pulses appreciated JP Drain pulled without difficulty. Intact  Assessment/Plan: 1 Day Post-Op Procedure(s) (LRB): TOTAL HIP ARTHROPLASTY (Left) Procedure(s) (LRB): TOTAL HIP ARTHROPLASTY (Left) Past Medical History:  Diagnosis Date   Acute pain of left knee    Adenomatous colon polyp    Anemia  Chronic bilateral low back pain with bilateral sciatica    Elevated hemoglobin A1c    Gastroesophageal reflux disease, unspecified whether esophagitis present    History of hiatal hernia    tiny   Hyperlipidemia    Hypertension    Hypothyroidism    Lumbar radiculitis    Lumbar spondylosis    Palpitations    PONV (postoperative nausea and vomiting)    Primary osteoarthritis of left hip    Rectal bleeding    Principal Problem:   Hx of total hip arthroplasty, left  Estimated body mass index is 29.63 kg/m as calculated from the following:   Height as of this encounter: 5\' 8"  (1.727 m).   Weight as of this encounter: 88.4 kg.  Patient has passed all of her PT protocols.  She is stable for discharge home to continue with work with home health physical therapy.  Please continue to work on gait, strength and motion of this left hip.  Hip Preacutions   Discussed with the patient continuing to utilize ice over the bandage   Patient will wear TED hose bilaterally to help prevent DVT and clot formation   Discussed the Aquacel bandage.  This bandage will stay in place 7 days postoperatively.  Can be replaced with honeycomb bandages that will be sent home with the patient   Discussed  sending the patient home with tramadol and oxycodone for as needed pain management.  Patient will also be sent home with Celebrex to help with swelling and inflammation.  Patient will take an 81 mg aspirin twice daily for DVT prophylaxis   JP drain removed without difficulty, intact   Weight-Bearing as tolerated to left leg   Patient will follow-up with [facility] clinic orthopedics in 6 weeks for re-imaging and reevaluation  Diet - Regular diet Follow up - in 6 weeks Activity - WBAT Disposition - Home Condition Upon Discharge - Good DVT Prophylaxis - Aspirin and TED hose  Danise Edge, PA-C Orthopaedic Surgery 08/24/2023, 7:55 AM

## 2023-08-24 NOTE — Plan of Care (Signed)
  Problem: Activity: Goal: Ability to avoid complications of mobility impairment will improve Outcome: Progressing   Problem: Pain Management: Goal: Pain level will decrease with appropriate interventions Outcome: Progressing   

## 2023-08-24 NOTE — Progress Notes (Signed)
Subjective: 1 Day Post-Op Procedure(s) (LRB): TOTAL HIP ARTHROPLASTY (Left) Patient reports pain as mild.   Patient seen in rounds with Dr. Ernest Pine. Patient is well, and has had no acute complaints or problems.  Denies any CP, SOB, N/B, fevers or chills We will continue with therapy today.  Plan is to go Home after hospital stay.  Objective: Vital signs in last 24 hours: Temp:  [97.6 F (36.4 C)-98.4 F (36.9 C)] 97.8 F (36.6 C) (11/07 0521) Pulse Rate:  [62-89] 64 (11/07 0730) Resp:  [11-19] 17 (11/07 0730) BP: (91-133)/(60-99) 125/69 (11/07 0730) SpO2:  [88 %-98 %] 95 % (11/06 2032)  Intake/Output from previous day:  Intake/Output Summary (Last 24 hours) at 08/24/2023 0753 Last data filed at 08/24/2023 0741 Gross per 24 hour  Intake 1100 ml  Output 530 ml  Net 570 ml    Intake/Output this shift: Total I/O In: -  Out: 30 [Drains:30]  Labs: No results for input(s): "HGB" in the last 72 hours. No results for input(s): "WBC", "RBC", "HCT", "PLT" in the last 72 hours. No results for input(s): "NA", "K", "CL", "CO2", "BUN", "CREATININE", "GLUCOSE", "CALCIUM" in the last 72 hours. No results for input(s): "LABPT", "INR" in the last 72 hours.  EXAM General - Patient is Alert, Appropriate, and Oriented Extremity - Neurologically intact ABD soft Neurovascular intact Sensation intact distally Intact pulses distally Dorsiflexion/Plantar flexion intact No cellulitis present Compartment soft Dressing - dressing C/D/I and no drainage Motor Function - intact, moving foot and toes well on exam. Able to plantar and dorsiflex with good strength and ROM.  She is neurovascularly intact all dermatomes down her left lower extremity.  Posterior tibial pulses appreciated JP Drain pulled without difficulty. Intact  Past Medical History:  Diagnosis Date   Acute pain of left knee    Adenomatous colon polyp    Anemia    Chronic bilateral low back pain with bilateral sciatica     Elevated hemoglobin A1c    Gastroesophageal reflux disease, unspecified whether esophagitis present    History of hiatal hernia    tiny   Hyperlipidemia    Hypertension    Hypothyroidism    Lumbar radiculitis    Lumbar spondylosis    Palpitations    PONV (postoperative nausea and vomiting)    Primary osteoarthritis of left hip    Rectal bleeding     Assessment/Plan: 1 Day Post-Op Procedure(s) (LRB): TOTAL HIP ARTHROPLASTY (Left) Principal Problem:   Hx of total hip arthroplasty, left  Estimated body mass index is 29.63 kg/m as calculated from the following:   Height as of this encounter: 5\' 8"  (1.727 m).   Weight as of this encounter: 88.4 kg. Advance diet Up with therapy  Patient will continue to work with physical therapy to pass postoperative PT protocols, ROM and strengthening  Hip Preacutions  Discussed with the patient continuing to utilize ice over the bandage  Patient will wear TED hose bilaterally to help prevent DVT and clot formation  Discussed the Aquacel bandage.  This bandage will stay in place 7 days postoperatively.  Can be replaced with honeycomb bandages that will be sent home with the patient  Discussed sending the patient home with tramadol and oxycodone for as needed pain management.  Patient will also be sent home with Celebrex to help with swelling and inflammation.  Patient will take an 81 mg aspirin twice daily for DVT prophylaxis  JP drain removed without difficulty, intact  Weight-Bearing as tolerated to left leg  Patient will follow-up with [facility] orthopedics in 6 weeks for re-imaging and reevaluation   Rayburn Go, PA-C [facility] Orthopaedics 08/24/2023, 7:53 AM

## 2023-08-24 NOTE — Anesthesia Postprocedure Evaluation (Signed)
Anesthesia Post Note  Patient: Lauren Watkins  Procedure(s) Performed: TOTAL HIP ARTHROPLASTY (Left: Hip)  Anesthesia Type: Spinal Anesthetic complications: no   No notable events documented.   Last Vitals:  Vitals:   08/24/23 0521 08/24/23 0730  BP: 128/68 125/69  Pulse: 62 64  Resp: 16 17  Temp: 36.6 C   SpO2:      Last Pain:  Vitals:   08/24/23 0521  TempSrc: Oral  PainSc: 0-No pain                 [name]

## 2023-08-24 NOTE — Progress Notes (Signed)
Physical Therapy Treatment Patient Details Name: Lauren Watkins MRN: [id number] DOB: [birth year] Today's Date: 08/24/2023   History of Present Illness Pt is a 82 y.o. F who is POD 0 of L THA as of evaluation. MD assessment includes history of GERD, HTN, anxiety, palpitations, hypothyroidism, and hyperlipidemia.    PT Comments  Pt is alert and motivated. Agreeable to session and cooperative throughout. Endorses feeling "sore" + 2-3/10 pain with movement/wt bearing. She was able to safely exit bed, stand, and ambulate with RW without safety concerns. Lauren Watkins demonstrated safe performance of stairs to simulate home entry. Author reviewed hip precautions, car transfers, importance of polar care, and expectation going forward. Pt states understanding. Author will provide education to spouse once arrives to hospital. Overall, pt is progress well and cleared form an acute PT standpoint for safe DC home.    If plan is discharge home, recommend the following: A little help with walking and/or transfers;A little help with bathing/dressing/bathroom;Assistance with cooking/housework;Help with stairs or ramp for entrance;Assist for transportation     Equipment Recommendations  None recommended by PT (pt has recieved personal equipment already)       Precautions / Restrictions Precautions Precautions: Fall;Posterior Hip Precaution Booklet Issued: Yes (comment) Precaution Comments: educated on and shared booklet with 3 post. approach THA precautions Restrictions Weight Bearing Restrictions: Yes LLE Weight Bearing: Weight bearing as tolerated     Mobility  Bed Mobility Overal bed mobility: Needs Assistance Bed Mobility: Supine to Sit  Supine to sit: Supervision  General bed mobility comments: increased time required to exit bed    Transfers Overall transfer level: Needs assistance Equipment used: Rolling walker (2 wheels) Transfers: Sit to/from Stand Sit to Stand: Supervision      Ambulation/Gait Ambulation/Gait assistance: Supervision Gait Distance (Feet): 200 Feet Assistive device: Rolling walker (2 wheels) Gait Pattern/deviations: Decreased step length - right, Decreased step length - left, Decreased stride length, Decreased weight shift to left, Step-through pattern, Antalgic Gait velocity: decreased  General Gait Details: no LOB or safety cioncern during ambulation with RW.   Stairs Stairs: Yes Stairs assistance: Supervision Stair Management: Backwards, Step to pattern, With walker Number of Stairs: 2 General stair comments: pt does not have rails at home however was easily able to perform ascedning descendingstairs to simulate home entry    Balance Overall balance assessment: Modified Independent  Standing balance support: Bilateral upper extremity supported, During functional activity, Reliant on assistive device for balance Standing balance-Lauren Watkins Scale: Good       Cognition Arousal: Alert Behavior During Therapy: WFL for tasks assessed/performed Overall Cognitive Status: Within Functional Limits for tasks assessed          General Comments General comments (skin integrity, edema, etc.): reviewed car transfers, HEP, importance of ambulation hourly and need for icing      Pertinent Vitals/Pain Pain Assessment Pain Assessment: 0-10 Pain Score: 3  Pain Location: L leg Pain Descriptors / Indicators: Aching, Sore Pain Intervention(s): Limited activity within patient's tolerance, Monitored during session, Premedicated before session, Repositioned, Ice applied    Home Living Family/patient expects to be discharged to:: Private residence Living Arrangements: Spouse/significant other Available Help at Discharge: Family;Available 24 hours/day Type of Home: House Home Access: Stairs to enter Entrance Stairs-Rails: None Entrance Stairs-Number of Steps: 2   Home Layout: One level Home Equipment: BSC/3in1          PT Goals (current goals can  now be found in the care plan section) Acute Rehab PT Goals Patient Stated  Goal: go home today Progress towards PT goals: Progressing toward goals    Frequency    BID       AM-PAC PT "6 Clicks" Mobility   Outcome Measure  Help needed turning from your back to your side while in a flat bed without using bedrails?: None Help needed moving from lying on your back to sitting on the side of a flat bed without using bedrails?: A Little Help needed moving to and from a bed to a chair (including a wheelchair)?: A Little Help needed standing up from a chair using your arms (e.g., wheelchair or bedside chair)?: A Little Help needed to walk in hospital room?: A Little Help needed climbing 3-5 steps with a railing? : A Little 6 Click Score: 19    End of Session   Activity Tolerance: Patient tolerated treatment well Patient left: in chair;with call bell/phone within reach Nurse Communication: Mobility status;Other (comment) PT Visit Diagnosis: Other abnormalities of gait and mobility (R26.89);Pain;Unsteadiness on feet (R26.81) Pain - Right/Left: Left Pain - part of body: Hip     Time: 9147-8295 PT Time Calculation (min) (ACUTE ONLY): 24 min  Charges:    $Gait Training: 8-22 mins $Therapeutic Activity: 8-22 mins PT General Charges $$ ACUTE PT VISIT: 1 Visit                     Jetta Lout PTA 08/24/23, 10:11 AM

## 2023-08-24 NOTE — Progress Notes (Signed)
DISCHARGE NOTE:  Pt given discharge instructions and scripts, she verbalized understanding. TED hose on both legs. 2 honeycomb dressings and walker sent with pt. Pt wheeled to car by staff. Husband providing transportation home.

## 2023-08-24 NOTE — Evaluation (Signed)
Occupational Therapy Evaluation Patient Details Name: AMBERT Watkins MRN: [id number] DOB: 01/31/1941 Today's Date: 08/24/2023   History of Present Illness Pt is a 82 y.o. F who is POD 0 of L THA as of evaluation. MD assessment includes history of GERD, HTN, anxiety, palpitations, hypothyroidism, and hyperlipidemia.   Clinical Impression   [name] was seen for OT evaluation this date. Prior to hospital admission, pt was IND. Pt lives with spouse. Pt currently requires MAX A don B socks in sitting, no assist to don shirt. SUPERVISION + RW for ADL t/f. Pt instructed in falls prevention strategies, adapted dressing strategies, and compression stocking mgt. All education complete, will sign off. Upon hospital discharge, recommend no OT follow up.     If plan is discharge home, recommend the following: A lot of help with bathing/dressing/bathroom;Help with stairs or ramp for entrance    Functional Status Assessment  Patient has had a recent decline in their functional status and demonstrates the ability to make significant improvements in function in a reasonable and predictable amount of time.  Equipment Recommendations  None recommended by OT    Recommendations for Other Services       Precautions / Restrictions Precautions Precautions: Fall;Posterior Hip Restrictions Weight Bearing Restrictions: Yes LLE Weight Bearing: Weight bearing as tolerated      Mobility Bed Mobility               General bed mobility comments: not tested    Transfers Overall transfer level: Needs assistance Equipment used: Rolling walker (2 wheels) Transfers: Sit to/from Stand Sit to Stand: Supervision                  Balance Overall balance assessment: Needs assistance Sitting-balance support: Feet supported Sitting balance-Leahy Scale: Good     Standing balance support: No upper extremity supported, During functional activity Standing balance-Leahy Scale: Fair                              ADL either performed or assessed with clinical judgement   ADL Overall ADL's : Needs assistance/impaired                                       General ADL Comments: MAX A don B socks in sitting, no assist to don shirt. SUPERVISION + RW for ADL t/f      Pertinent Vitals/Pain Pain Assessment Pain Assessment: No/denies pain     Extremity/Trunk Assessment Upper Extremity Assessment Upper Extremity Assessment: Overall WFL for tasks assessed   Lower Extremity Assessment Lower Extremity Assessment: Generalized weakness       Communication Communication Communication: No apparent difficulties   Cognition Arousal: Alert Behavior During Therapy: WFL for tasks assessed/performed Overall Cognitive Status: Within Functional Limits for tasks assessed                                 General Comments: short term memory deficits, recalls 2/3 hip pcns                Home Living Family/patient expects to be discharged to:: Private residence Living Arrangements: Spouse/significant other Available Help at Discharge: Family;Available 24 hours/day Type of Home: House Home Access: Stairs to enter [employer] of Steps: 2 Entrance Stairs-Rails: None Home Layout: One level  Bathroom Shower/Tub: Chief Strategy Officer: Handicapped height     Home Equipment: BSC/3in1          Prior Functioning/Environment Prior Level of Function : Independent/Modified Independent             Mobility Comments: 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 - only limited by pain for long distances, exercised on elliptical for 30 mins 2x/day 6-7days/week prior          OT Problem List: Decreased strength;Decreased activity tolerance;Decreased range of motion;Impaired balance (sitting and/or standing);Decreased safety awareness         OT Goals(Current goals can be found in the care plan section) Acute Rehab OT  Goals Patient Stated Goal: to go home OT Goal Formulation: With patient/family Time For Goal Achievement: 08/24/23 Potential to Achieve Goals: Good   AM-PAC OT "6 Clicks" Daily Activity     Outcome Measure Help from another person eating meals?: None Help from another person taking care of personal grooming?: A Little Help from another person toileting, which includes using toliet, bedpan, or urinal?: A Little Help from another person bathing (including washing, rinsing, drying)?: A Little Help from another person to put on and taking off regular upper body clothing?: None Help from another person to put on and taking off regular lower body clothing?: A Lot 6 Click Score: 19   End of Session Equipment Utilized During Treatment: Rolling walker (2 wheels)  Activity Tolerance: Patient tolerated treatment well Patient left: in chair;with call bell/phone within reach;with family/visitor present  OT Visit Diagnosis: Other abnormalities of gait and mobility (R26.89);Muscle weakness (generalized) (M62.81)                Time: 5284-1324 OT Time Calculation (min): 14 min Charges:  OT General Charges $OT Visit: 1 Visit OT Evaluation $OT Eval Low Complexity: 1 Low  Kathie Dike, M.S. OTR/L  08/24/23, 9:22 AM  ascom [phone]

## 2023-09-01 ENCOUNTER — Encounter: Payer: Self-pay | Admitting: Orthopedic Surgery

## 2023-12-13 ENCOUNTER — Other Ambulatory Visit: Payer: Self-pay | Admitting: Internal Medicine

## 2023-12-13 DIAGNOSIS — Z1231 Encounter for screening mammogram for malignant neoplasm of breast: Secondary | ICD-10-CM

## 2024-07-11 ENCOUNTER — Ambulatory Visit
Admission: RE | Admit: 2024-07-11 | Discharge: 2024-07-11 | Disposition: A | Discharge status: Home / Self Care | Source: Ambulatory Visit | Attending: Internal Medicine | Admitting: Internal Medicine

## 2024-07-11 DIAGNOSIS — Z1231 Encounter for screening mammogram for malignant neoplasm of breast: Secondary | ICD-10-CM | POA: Insufficient documentation
